# Patient Record
Sex: Female | Born: 1960 | Hispanic: No | Smoking: Never smoker
Health system: Southern US, Community
[De-identification: ages and names within clinical notes are randomized; demographics above are authoritative.]

---

## 2006-09-04 ENCOUNTER — Other Ambulatory Visit: Admission: RE | Admit: 2006-09-04 | Discharge: 2006-09-04 | Payer: Self-pay | Admitting: Obstetrics and Gynecology

## 2015-06-26 ENCOUNTER — Ambulatory Visit (INDEPENDENT_AMBULATORY_CARE_PROVIDER_SITE_OTHER): Payer: Medicaid Other | Admitting: Sports Medicine

## 2015-06-26 ENCOUNTER — Ambulatory Visit (INDEPENDENT_AMBULATORY_CARE_PROVIDER_SITE_OTHER): Payer: Medicaid Other

## 2015-06-26 VITALS — BP 127/85 | HR 86 | Resp 18 | Wt 224.6 lb

## 2015-06-26 DIAGNOSIS — M25521 Pain in right elbow: Secondary | ICD-10-CM | POA: Diagnosis not present

## 2015-06-26 DIAGNOSIS — M7551 Bursitis of right shoulder: Secondary | ICD-10-CM | POA: Diagnosis not present

## 2015-06-26 DIAGNOSIS — M7711 Lateral epicondylitis, right elbow: Secondary | ICD-10-CM

## 2015-06-26 DIAGNOSIS — M25511 Pain in right shoulder: Secondary | ICD-10-CM | POA: Diagnosis not present

## 2015-06-26 DIAGNOSIS — M25531 Pain in right wrist: Secondary | ICD-10-CM | POA: Diagnosis not present

## 2015-06-26 DIAGNOSIS — M75101 Unspecified rotator cuff tear or rupture of right shoulder, not specified as traumatic: Secondary | ICD-10-CM | POA: Insufficient documentation

## 2015-06-26 NOTE — Assessment & Plan Note (Signed)
Has done some home rehabilitation exercises, desires injection.  Common extensor tendon origin injection as above. Counterforce brace. Return in one month.

## 2015-06-26 NOTE — Assessment & Plan Note (Signed)
Likely simple contusion, scaphoid x-rays ordered.

## 2015-06-26 NOTE — Assessment & Plan Note (Signed)
Subacromial injection as above, x-rays, formal physical therapy.

## 2015-06-26 NOTE — Progress Notes (Signed)
Subjective:    CC: Right shoulder, elbow, wrist pain  HPI:  Things developed over a month or so, pain over the deltoid worse with overhead activities, pain at the lateral epicondyle, as well as pain in this anatomical snuffbox after a fall into an outstretched hand. Has done some home exercises and over-the-counter analgesics without any improvement, pain is moderate, persistent and she desires interventional treatment today. Nothing radicular into the hand.  Past medical history, Surgical history, Family history not pertinant except as noted below, Social history, Allergies, and medications have been entered into the medical record, reviewed, and no changes needed.   Review of Systems: No headache, visual changes, nausea, vomiting, diarrhea, constipation, dizziness, abdominal pain, skin rash, fevers, chills, night sweats, swollen lymph nodes, weight loss, chest pain, body aches, joint swelling, muscle aches, shortness of breath, mood changes, visual or auditory hallucinations.  Objective:    General: Well Developed, well nourished, and in no acute distress.  Neuro: Alert and oriented x3, extra-ocular muscles intact, sensation grossly intact.  HEENT: Normocephalic, atraumatic, pupils equal round reactive to light, neck supple, no masses, no lymphadenopathy, thyroid nonpalpable.  Skin: Warm and dry, no rashes noted.  Cardiac: Regular rate and rhythm, no murmurs rubs or gallops.  Respiratory: Clear to auscultation bilaterally. Not using accessory muscles, speaking in full sentences.  Abdominal: Soft, nontender, nondistended, positive bowel sounds, no masses, no organomegaly.  Right Elbow: Unremarkable to inspection. Range of motion full pronation, supination, flexion, extension. Strength is full to all of the above directions Stable to varus, valgus stress. Negative moving valgus stress test. Tender to palpation at the common extensor tendon origin with reproduction of pain with resisted  extension of the middle finger. Ulnar nerve does not sublux. Negative cubital tunnel Tinel's. Right Shoulder: Inspection reveals no abnormalities, atrophy or asymmetry. Palpation is normal with no tenderness over AC joint or bicipital groove. ROM is full in all planes. Rotator cuff strength normal throughout. Positive Neer and Hawkin's tests, empty can. Speeds and Yergason's tests normal. No labral pathology noted with negative Obrien's, negative crank, negative clunk, and good stability. Normal scapular function observed. No painful arc and no drop arm sign. No apprehension sign Right Wrist: Inspection normal with no visible erythema or swelling. ROM smooth and normal with good flexion and extension and ulnar/radial deviation that is symmetrical with opposite wrist. Palpation is normal over metacarpals, navicular, lunate, and TFCC; tendons without tenderness/ swelling Tender to palpation in the snuffbox No tenderness over Canal of [place]. Strength 5/5 in all directions without pain. Negative Finkelstein, tinel's and phalens. Negative Watson's test.  Procedure: Real-time Ultrasound Guided Injection of right subacromial bursa Device: GE Logiq E  Verbal informed consent obtained.  Time-out conducted.  Noted no overlying erythema, induration, or other signs of local infection.  Skin prepped in a sterile fashion.  Local anesthesia: Topical Ethyl chloride.  With sterile technique and under real time ultrasound guidance:  1 mL lidocaine, 1 mL Marcaine, 1 mL kenalog 40 injected easily. Completed without difficulty  Pain immediately resolved suggesting accurate placement of the medication.  Advised to call if fevers/chills, erythema, induration, drainage, or persistent bleeding.  Images permanently stored and available for review in the ultrasound unit.  Impression: Technically successful ultrasound guided injection.  Procedure: Real-time Ultrasound Guided Injection of right common  extensor tendon origin Device: GE Logiq E  Verbal informed consent obtained.  Time-out conducted.  Noted no overlying erythema, induration, or other signs of local infection.  Skin prepped in a sterile  fashion.  Local anesthesia: Topical Ethyl chloride.  With sterile technique and under real time ultrasound guidance:  Medication injected both superficial to and deep to the common extensor tendon origin at the lateral epicondyle for a total of 1 mL lidocaine, 1 mL Marcaine, 1 mL kenalog 40. Completed without difficulty  Pain immediately resolved suggesting accurate placement of the medication.  Advised to call if fevers/chills, erythema, induration, drainage, or persistent bleeding.  Images permanently stored and available for review in the ultrasound unit.  Impression: Technically successful ultrasound guided injection. Impression and Recommendations:    The patient was counselled, risk factors were discussed, anticipatory guidance given.

## 2015-07-14 ENCOUNTER — Ambulatory Visit: Payer: Medicaid Other

## 2015-07-15 ENCOUNTER — Ambulatory Visit: Payer: Medicaid Other

## 2015-07-21 ENCOUNTER — Ambulatory Visit: Payer: Self-pay | Admitting: Physical Therapy

## 2015-07-21 ENCOUNTER — Ambulatory Visit: Payer: Medicaid Other | Attending: Sports Medicine | Admitting: Physical Therapy

## 2015-07-21 DIAGNOSIS — M25521 Pain in right elbow: Secondary | ICD-10-CM | POA: Insufficient documentation

## 2015-07-21 DIAGNOSIS — M25511 Pain in right shoulder: Secondary | ICD-10-CM | POA: Insufficient documentation

## 2015-07-22 NOTE — Therapy (Signed)
[facility] [place] [address]  [address] [place], [place], [address] Phone: [phone]   Fax:  [phone]  Physical Therapy Evaluation  Patient Details  Name: Elizabeth Rich MRN: [id number] Date of Birth: Oct 27, 1960 Referring Provider: Monica Bectonhomas J Thekkekandam, MD  Encounter Date: 07/21/2015      PT End of Session - 07/21/15 1835    Visit Number 1   Authorization Type Medicaid   PT Start Time 1530   PT Stop Time 1615   PT Time Calculation (min) 45 min   Activity Tolerance Patient tolerated treatment well   Behavior During Therapy [facility] for tasks assessed/performed      No past medical history on file.  No past surgical history on file.  There were no vitals filed for this visit.       Subjective Assessment - 07/21/15 1531    Subjective Pt reports pain in R shoulder & elbow started in 01/2016 after sleeping on R side on air mattress for 3 weeks while helping someone move. Has received injections in June  to both and currently w/o pain. In late May/early June, pt reports she fell landing on her L wrist and still has some pan at times from this.   Diagnostic tests All imaging was negative for acute pathology.   Patient Stated Goals To have some exercises to work on to help with my problems.   Currently in Pain? No/denies            [facility] - 07/21/15 1530    Assessment   Medical Diagnosis R Lateral epicondylitis & RTC dysfunction   Referring Provider Monica Bectonhomas J Thekkekandam, MD   Onset Date/Surgical Date --  01/2015   Hand Dominance Right   Prior Therapy none   Precautions   Required Braces or Orthoses Other Brace/Splint   Other Brace/Splint Elbow band for lateral epicondylitis   Prior Function   Level of Independence Independent   Vocation Unemployed   Posture/Postural Control   Posture Comments Slightly forward R shoulder   ROM / Strength   AROM / PROM / Strength AROM;Strength   AROM   Overall AROM  Comments R shoulder symmetrical to L except flexion and IR; Elbow, forearm and wrist WNL   AROM Assessment Site Shoulder;Elbow;Forearm;Wrist   Right/Left Shoulder Right;Left   Right Shoulder Flexion 127 Degrees   Right Shoulder ABduction 148 Degrees   Right Shoulder Internal Rotation 86 Degrees   Right Shoulder External Rotation 93 Degrees   Left Shoulder Flexion 140 Degrees   Strength   Strength Assessment Site Shoulder;Elbow;Forearm;Wrist;Hand   Right/Left Shoulder Right;Left   Right Shoulder Flexion 4/5   Right Shoulder ABduction 4/5   Right Shoulder Internal Rotation 4/5   Right Shoulder External Rotation 4-/5   Left Shoulder Flexion 4+/5   Left Shoulder ABduction 4+/5   Left Shoulder Internal Rotation 4+/5   Left Shoulder External Rotation 4+/5   Right/Left Elbow Right;Left   Right Elbow Flexion 4/5   Right Elbow Extension 4-/5   Left Elbow Flexion 4+/5   Left Elbow Extension 4+/5   Right/Left Forearm Right;Left   Right Forearm Pronation 4/5   Right Forearm Supination 4-/5   Left Forearm Pronation 4+/5   Left Forearm Supination 4+/5   Right/Left Wrist Right;Left   Right Wrist Flexion 4+/5   Right Wrist Extension 4/5   Right Wrist Radial Deviation 4/5   Right Wrist Ulnar Deviation 4/5   Left Wrist Flexion 4+/5   Left Wrist  Extension 4+/5   Left Wrist Radial Deviation 4+/5   Left Wrist Ulnar Deviation 4+/5   Right/Left hand Right;Left   Right Hand Grip (lbs) 29   Left Hand Grip (lbs) 35          Today's Treatment  Postural education  Instruction in HEP per "Pt Instructions" with pt able to perform return demonstration of 1 set of each stretch/exercise appropriately           PT Education - 07/21/15 1610    Education provided Yes   Education Details Postural awareness and HEP   Person(s) Educated Patient   Methods Explanation;Demonstration;Handout   Comprehension Verbalized understanding;Returned demonstration             PT Long Term Goals  - 07/21/15 1615    PT LONG TERM GOAL #1   Title Pt will be independent with HEP   Status Achieved               Plan - 07/21/15 1611    Clinical Impression Statement Elizabeth Rich is a 55 y/o female who presents to OP with R shoulder and elbow pain originating in 01/2016 after sleeping on R side on air mattress for 3 weeks. Pt also sustained trauma to R UE when she fell landing on her R wrist in late May/early June. Pt has since received injections in June to both shoulder and elbow, and is currently w/o pain. Assessment revealed B UE ROM WFL but slightly limited shoulder flexion on R (127 dg) as compared to L (140 dg). Pt is R hand dominant but R UE strength only 4-/5 to 4/5 as compared to 4+/5 on L with R grip strength 29# vs 35# on L. Postural assessment reveals slight forward positioning of L shoulder. Weakness limits pt's functional use of R UE with lifting or carrying objects in R hand. Pt limited to 1 visit per [facility] insurance therefore comprehensive HEP provided targeting improving postural awareness including increasing muscle flexibility along with soft tissue pliability, R shoulder ROM, scapular and RTC strengthening/stabilization, along with forearm and wrist strengthening. Pt able to perform return demonstration of all exercises and instructions provided for continued progression of exercises as tolerated.   Rehab Potential Good   PT Frequency One time visit   PT Treatment/Interventions Patient/family education;Therapeutic exercise;ADLs/Self Care Home Management   PT Next Visit Plan n/a   Consulted and Agree with Plan of Care Patient      Patient will benefit from skilled therapeutic intervention in order to improve the following deficits and impairments:  Pain, Impaired flexibility, Decreased range of motion, Decreased strength, Impaired UE functional use  Visit Diagnosis: Pain in right shoulder  Pain in right elbow     Problem List Patient Active Problem List   Diagnosis  Date Noted  . Subacromial bursitis 06/26/2015  . Lateral epicondylitis of right elbow 06/26/2015  . Right wrist pain 06/26/2015    Marry Guan, PT, MPT 07/22/2015, 8:30 AM  [facility] [address]  [address] [place], [place], [address] Phone: [phone]   Fax:  [phone]  Name: Greidy Sherard MRN: [id number] Date of Birth: April 14, 1960

## 2015-07-24 ENCOUNTER — Ambulatory Visit: Payer: Medicaid Other | Admitting: Sports Medicine

## 2016-02-04 ENCOUNTER — Ambulatory Visit (INDEPENDENT_AMBULATORY_CARE_PROVIDER_SITE_OTHER): Payer: Medicaid Other | Admitting: Sports Medicine

## 2016-02-04 DIAGNOSIS — M7550 Bursitis of unspecified shoulder: Secondary | ICD-10-CM | POA: Diagnosis not present

## 2016-02-04 DIAGNOSIS — M7711 Lateral epicondylitis, right elbow: Secondary | ICD-10-CM | POA: Diagnosis not present

## 2016-02-04 NOTE — Assessment & Plan Note (Signed)
Eight-month response to previous injection, repeat injection today.

## 2016-02-04 NOTE — Assessment & Plan Note (Signed)
Eight-month response to previous injection, repeat injection today. 

## 2016-02-04 NOTE — Progress Notes (Signed)
Subjective:    CC: Follow-up  HPI: I saw Elizabeth Rich about 8 months ago for right subacromial bursitis and lateral epicondylitis, we injected her at that time and she had a fantastic response rate months, desires repeat injections today, pain is moderate, persistent, localized over the deltoid and worse with overhead activities, also has some pain that is localized directly over the lateral upper condyle and worse with resisted wrist extension.  Past medical history:  Negative.  See flowsheet/record as well for more information.  Surgical history: Negative.  See flowsheet/record as well for more information.  Family history: Negative.  See flowsheet/record as well for more information.  Social history: Negative.  See flowsheet/record as well for more information.  Allergies, and medications have been entered into the medical record, reviewed, and no changes needed.   Review of Systems: No fevers, chills, night sweats, weight loss, chest pain, or shortness of breath.   Objective:    General: Well Developed, well nourished, and in no acute distress.  Neuro: Alert and oriented x3, extra-ocular muscles intact, sensation grossly intact.  HEENT: Normocephalic, atraumatic, pupils equal round reactive to light, neck supple, no masses, no lymphadenopathy, thyroid nonpalpable.  Skin: Warm and dry, no rashes. Cardiac: Regular rate and rhythm, no murmurs rubs or gallops, no lower extremity edema.  Respiratory: Clear to auscultation bilaterally. Not using accessory muscles, speaking in full sentences. Right Shoulder: Inspection reveals no abnormalities, atrophy or asymmetry. Palpation is normal with no tenderness over AC joint or bicipital groove. ROM is full in all planes. Rotator cuff strength normal throughout. Positive Neer and Hawkin's tests, empty can. Speeds and Yergason's tests normal. No labral pathology noted with negative Obrien's, negative crank, negative clunk, and good stability. Normal  scapular function observed. No painful arc and no drop arm sign. No apprehension sign Right Elbow: Unremarkable to inspection. Range of motion full pronation, supination, flexion, extension. Strength is full to all of the above directions Stable to varus, valgus stress. Negative moving valgus stress test. Tender to palpation of the common extensor tendon origin Ulnar nerve does not sublux. Negative cubital tunnel Tinel's.  Procedure: Real-time Ultrasound Guided Injection of right subacromial bursa Device: GE Logiq E  Verbal informed consent obtained.  Time-out conducted.  Noted no overlying erythema, induration, or other signs of local infection.  Skin prepped in a sterile fashion.  Local anesthesia: Topical Ethyl chloride.  With sterile technique and under real time ultrasound guidance:  1 mL kenalog 40, 1 mL lidocaine, 1 mL Marcaine injected easily Completed without difficulty  Pain immediately resolved suggesting accurate placement of the medication.  Advised to call if fevers/chills, erythema, induration, drainage, or persistent bleeding.  Images permanently stored and available for review in the ultrasound unit.  Impression: Technically successful ultrasound guided injection.  Procedure: Real-time Ultrasound Guided Injection of right common extensor tendon origin Device: GE Logiq E  Verbal informed consent obtained.  Time-out conducted.  Noted no overlying erythema, induration, or other signs of local infection.  Skin prepped in a sterile fashion.  Local anesthesia: Topical Ethyl chloride.  With sterile technique and under real time ultrasound guidance:  1 mL kenalog 40, 1 mL lidocaine, 1 mL Marcaine injected easily Completed without difficulty  Pain immediately resolved suggesting accurate placement of the medication.  Advised to call if fevers/chills, erythema, induration, drainage, or persistent bleeding.  Images permanently stored and available for review in the  ultrasound unit.  Impression: Technically successful ultrasound guided injection.  Impression and Recommendations:    Lateral  epicondylitis of right elbow Eight-month response to previous injection, repeat injection today.  Subacromial bursitis Eight-month response to previous injection, repeat injection today.

## 2016-07-19 ENCOUNTER — Ambulatory Visit (INDEPENDENT_AMBULATORY_CARE_PROVIDER_SITE_OTHER): Payer: Medicaid Other | Admitting: Sports Medicine

## 2016-07-19 DIAGNOSIS — M7550 Bursitis of unspecified shoulder: Secondary | ICD-10-CM

## 2016-07-19 DIAGNOSIS — M7711 Lateral epicondylitis, right elbow: Secondary | ICD-10-CM

## 2016-07-19 NOTE — Progress Notes (Signed)
Subjective:    CC: Shoulder and elbow pain  HPI: This is a pleasant 56 year old female, we injected her right common extensor tendon origin as well as right subacromial bursa back in January, she did well but admits that she has not been compliant with her rehabilitation exercises. She's having recurrence of pain, moderate, persistent, localized over the deltoid and worse with overhead activities but also over the common extensor tendon origin. Desires repeat interventional treatment today.  Past medical history:  Negative.  See flowsheet/record as well for more information.  Surgical history: Negative.  See flowsheet/record as well for more information.  Family history: Negative.  See flowsheet/record as well for more information.  Social history: Negative.  See flowsheet/record as well for more information.  Allergies, and medications have been entered into the medical record, reviewed, and no changes needed.   Review of Systems: No fevers, chills, night sweats, weight loss, chest pain, or shortness of breath.   Objective:    General: Well Developed, well nourished, and in no acute distress.  Neuro: Alert and oriented x3, extra-ocular muscles intact, sensation grossly intact.  HEENT: Normocephalic, atraumatic, pupils equal round reactive to light, neck supple, no masses, no lymphadenopathy, thyroid nonpalpable.  Skin: Warm and dry, no rashes. Cardiac: Regular rate and rhythm, no murmurs rubs or gallops, no lower extremity edema.  Respiratory: Clear to auscultation bilaterally. Not using accessory muscles, speaking in full sentences. Right shoulder: Inspection reveals no abnormalities, atrophy or asymmetry. Palpation is normal with no tenderness over AC joint or bicipital groove. ROM is full in all planes. Rotator cuff strength normal throughout. Positive Neer and Hawkin's tests, empty can. Speeds and Yergason's tests normal. No labral pathology noted with negative Obrien's, negative  crank, negative clunk, and good stability. Normal scapular function observed. No painful arc and no drop arm sign. No apprehension sign Right Elbow: Unremarkable to inspection. Range of motion full pronation, supination, flexion, extension. Strength is full to all of the above directions Stable to varus, valgus stress. Negative moving valgus stress test. Tender to palpation at the common extensor tendon origin Ulnar nerve does not sublux. Negative cubital tunnel Tinel's.  Procedure: Real-time Ultrasound Guided Injection of right subacromial bursa Device: GE Logiq E  Verbal informed consent obtained.  Time-out conducted.  Noted no overlying erythema, induration, or other signs of local infection.  Skin prepped in a sterile fashion.  Local anesthesia: Topical Ethyl chloride.  With sterile technique and under real time ultrasound guidance:  1 mL kenalog 40, 1 mL lidocaine, 1 mL Marcaine injected easily Completed without difficulty  Pain immediately resolved suggesting accurate placement of the medication.  Advised to call if fevers/chills, erythema, induration, drainage, or persistent bleeding.  Images permanently stored and available for review in the ultrasound unit.  Impression: Technically successful ultrasound guided injection.  Procedure: Real-time Ultrasound Guided Injection of right common extensor tendon origin Device: GE Logiq E  Verbal informed consent obtained.  Time-out conducted.  Noted no overlying erythema, induration, or other signs of local infection.  Skin prepped in a sterile fashion.  Local anesthesia: Topical Ethyl chloride.  With sterile technique and under real time ultrasound guidance:  1 mL kenalog 40, 1 mL lidocaine, 1 mL Marcaine injected easily Completed without difficulty  Pain immediately resolved suggesting accurate placement of the medication.  Advised to call if fevers/chills, erythema, induration, drainage, or persistent bleeding.  Images  permanently stored and available for review in the ultrasound unit.  Impression: Technically successful ultrasound guided injection.  Impression  and Recommendations:    Lateral epicondylitis of right elbow 5 month response to previous injection, repeated today. Has not been diligent with rehabilitation exercises and agrees to do these more.  Subacromial bursitis 5 month response to previous injection, repeated today. Has not been diligent with rehabilitation exercises and agrees to do these more.

## 2016-07-19 NOTE — Assessment & Plan Note (Signed)
5 month response to previous injection, repeated today. Has not been diligent with rehabilitation exercises and agrees to do these more. 

## 2016-07-19 NOTE — Assessment & Plan Note (Signed)
5 month response to previous injection, repeated today. Has not been diligent with rehabilitation exercises and agrees to do these more.

## 2016-07-25 ENCOUNTER — Telehealth: Payer: Self-pay

## 2016-07-25 DIAGNOSIS — M67919 Unspecified disorder of synovium and tendon, unspecified shoulder: Secondary | ICD-10-CM

## 2016-07-25 DIAGNOSIS — M7710 Lateral epicondylitis, unspecified elbow: Secondary | ICD-10-CM

## 2016-07-25 NOTE — Telephone Encounter (Signed)
Pt was seen on 07/19/16.  She got an injection in her shoulder and elbow.  She reports that the injections helped a little bit but the pain never completely went away.  She stated that the pain is worse than it was before the injections.  She said when she got injections back in January the pain went away shortly after. She wants to know should she schedule an follow up appointment. Please advise -EH/RMA

## 2016-07-25 NOTE — Telephone Encounter (Signed)
Worse in both the elbow and shoulder or one or the other?  Also she hadn't been doing the rehab exercises.  Please remind her that the injections aren't supposed to provide any long term relief but that AGGRESSIVELY rehabbing the elbow and shoulder will.  Rehab makes the pain go away.  We may need to put her into formal physical therapy.  Exercise is medicine and in general we don't push patients hard enough to do the actual hard work/labor they need to be doing to improve long term.

## 2016-07-26 ENCOUNTER — Ambulatory Visit: Payer: Self-pay | Admitting: Sports Medicine

## 2016-07-26 NOTE — Telephone Encounter (Signed)
Ice for 20 mins TID.  Adding referral for physical therapy.

## 2016-07-26 NOTE — Telephone Encounter (Signed)
Pt stated that it's both her elbow and shoulder. She said that she never did the exercisers even after the first set injections.  She wants to know is it ok to use ice and icy hot. Pt reports no swelling or redness.  I did stress to pt that with PT and time this would get better. -EH/RMA

## 2016-07-27 NOTE — Telephone Encounter (Signed)
Recommendations left on vm -EH/RMA  

## 2016-08-08 ENCOUNTER — Ambulatory Visit: Payer: Medicaid Other | Admitting: Physical Therapy

## 2016-08-19 ENCOUNTER — Ambulatory Visit (INDEPENDENT_AMBULATORY_CARE_PROVIDER_SITE_OTHER): Payer: Medicaid Other | Admitting: Sports Medicine

## 2016-08-19 ENCOUNTER — Ambulatory Visit (INDEPENDENT_AMBULATORY_CARE_PROVIDER_SITE_OTHER): Payer: Medicaid Other

## 2016-08-19 ENCOUNTER — Telehealth: Payer: Self-pay

## 2016-08-19 ENCOUNTER — Encounter: Payer: Self-pay | Admitting: Sports Medicine

## 2016-08-19 DIAGNOSIS — M170 Bilateral primary osteoarthritis of knee: Secondary | ICD-10-CM | POA: Diagnosis not present

## 2016-08-19 MED ORDER — MELOXICAM 15 MG PO TABS: ORAL_TABLET | ORAL | 3 refills | Status: DC

## 2016-08-19 NOTE — Progress Notes (Signed)
   Subjective:    I'm seeing this patient as a consultation for:  Tobie LordsLauren Young, PA-C  CC: Bilateral knee pain  HPI: This is a pleasant 56 year old female, for years she's had severe pain on and off that she localizes at the medial joint line of both knees with gelling, no mechanical symptoms, no trauma. Pain is moderate, persistent, she has had injections in the past, today her pain is fairly severe and she desires interventional treatment today.  Past medical history, Surgical history, Family history not pertinant except as noted below, Social history, Allergies, and medications have been entered into the medical record, reviewed, and no changes needed.   Review of Systems: No headache, visual changes, nausea, vomiting, diarrhea, constipation, dizziness, abdominal pain, skin rash, fevers, chills, night sweats, weight loss, swollen lymph nodes, body aches, joint swelling, muscle aches, chest pain, shortness of breath, mood changes, visual or auditory hallucinations.   Objective:   General: Well Developed, well nourished, and in no acute distress.  Neuro:  Extra-ocular muscles intact, able to move all 4 extremities, sensation grossly intact.  Deep tendon reflexes tested were normal. Psych: Alert and oriented, mood congruent with affect. ENT:  Ears and nose appear unremarkable.  Hearing grossly normal. Neck: Unremarkable overall appearance, trachea midline.  No visible thyroid enlargement. Eyes: Conjunctivae and lids appear unremarkable.  Pupils equal and round. Skin: Warm and dry, no rashes noted.  Cardiovascular: Pulses palpable, no extremity edema. Bilateral knees: Tender to palpation at the medial joint lines, no swelling, no effusion. ROM normal in flexion and extension and lower leg rotation. Ligaments with solid consistent endpoints including ACL, PCL, LCL, MCL. Negative Mcmurray's and provocative meniscal tests. Non painful patellar compression. Patellar and quadriceps tendons  unremarkable. Hamstring and quadriceps strength is normal.  Procedure: Real-time Ultrasound Guided Injection of left knee Device: GE Logiq E  Verbal informed consent obtained.  Time-out conducted.  Noted no overlying erythema, induration, or other signs of local infection.  Skin prepped in a sterile fashion.  Local anesthesia: Topical Ethyl chloride.  With sterile technique and under real time ultrasound guidance:  1 mL Kenalog 40, 2 mL lidocaine, 2 mL bupivacaine injected easily Completed without difficulty  Pain immediately resolved suggesting accurate placement of the medication.  Advised to call if fevers/chills, erythema, induration, drainage, or persistent bleeding.  Images permanently stored and available for review in the ultrasound unit.  Impression: Technically successful ultrasound guided injection.  Procedure: Real-time Ultrasound Guided Injection of right knee Device: GE Logiq E  Verbal informed consent obtained.  Time-out conducted.  Noted no overlying erythema, induration, or other signs of local infection.  Skin prepped in a sterile fashion.  Local anesthesia: Topical Ethyl chloride.  With sterile technique and under real time ultrasound guidance:  1 mL Kenalog 40, 2 mL lidocaine, 2 mL bupivacaine injected easily Completed without difficulty  Pain immediately resolved suggesting accurate placement of the medication.  Advised to call if fevers/chills, erythema, induration, drainage, or persistent bleeding.  Images permanently stored and available for review in the ultrasound unit.  Impression: Technically successful ultrasound guided injection.  Impression and Recommendations:   This case required medical decision making of moderate complexity.  Primary osteoarthritis of both knees Bilateral injections, x-rays, meloxicam, formal physical therapy.  Return in one month.

## 2016-08-19 NOTE — Telephone Encounter (Signed)
Pt was seen today.  Is wanting to know what should she take for pain. She thought that she was getting Rx Vicodin. When can she start back driving and should she ice her knees. If so how often? Please advise. -EH/RMA

## 2016-08-19 NOTE — Telephone Encounter (Signed)
I sent in meloxicam for pain, also the injections were the MAIN treatment for the pain. She can start driving today, icing should be 20 minutes, 3-4 times per day.

## 2016-08-19 NOTE — Assessment & Plan Note (Signed)
Bilateral injections, x-rays, meloxicam, formal physical therapy.  Return in one month.

## 2016-08-22 MED ORDER — DICLOFENAC SODIUM 75 MG PO TBEC: 75.0000 mg | DELAYED_RELEASE_TABLET | Freq: Two times a day (BID) | ORAL | 3 refills | Status: DC

## 2016-08-22 NOTE — Telephone Encounter (Signed)
Pt reports that the meloxicam is making her sick.  What other meds can she take? -EH/RMA

## 2016-08-22 NOTE — Telephone Encounter (Signed)
Sick meaning nauseated or epigastric pain or both or something else?

## 2016-08-22 NOTE — Telephone Encounter (Signed)
Switching to diclofenac, she needs to take it with a moderate sized meal.

## 2016-08-22 NOTE — Telephone Encounter (Signed)
Pt notified -EH/RMA  

## 2016-08-22 NOTE — Telephone Encounter (Signed)
Sick meaning nausea -EH/RMA

## 2016-08-29 ENCOUNTER — Ambulatory Visit: Payer: Medicaid Other | Admitting: Physical Therapy

## 2016-09-01 ENCOUNTER — Ambulatory Visit: Payer: Medicaid Other | Attending: Sports Medicine | Admitting: Physical Therapy

## 2016-09-01 DIAGNOSIS — M25562 Pain in left knee: Secondary | ICD-10-CM

## 2016-09-01 DIAGNOSIS — M25561 Pain in right knee: Secondary | ICD-10-CM | POA: Diagnosis not present

## 2016-09-01 DIAGNOSIS — R262 Difficulty in walking, not elsewhere classified: Secondary | ICD-10-CM

## 2016-09-01 DIAGNOSIS — R29898 Other symptoms and signs involving the musculoskeletal system: Secondary | ICD-10-CM

## 2016-09-01 NOTE — Therapy (Signed)
[facility] [place] [address]  [address] [place], [place], [address] Phone: [phone]   Fax:  [phone]  Physical Therapy Evaluation  Patient Details  Name: Elizabeth Rich MRN: [id number] Date of Birth: Mar 07, 1960 Referring Provider: Dr. Benjamin Stainhekkekandam  Encounter Date: 09/01/2016      PT End of Session - 09/01/16 1124    Visit Number 1   Authorization Type Adult Medicaid - 1 visit only   PT Start Time 1102   PT Stop Time 1145   PT Time Calculation (min) 43 min   Activity Tolerance Patient tolerated treatment well   Behavior During Therapy [facility] for tasks assessed/performed      No past medical history on file.  No past surgical history on file.  There were no vitals filed for this visit.       Subjective Assessment - 09/01/16 1104    Subjective Patient reporting onset of knee pain after mattress shopping - had intense pain when sitting on/off beds as well as transferring in/out of car. Has been seen by MD - reports osteoarthritis. Recently had injections of B knees - reports 1 day of good pain relief, now having some discomfort and stiffness of B knees. Reports health issues that limits her out of home activities, but does try to do stairs daily within home.    Diagnostic tests Xray - "mild osteoarthritis of both knees"   Patient Stated Goals improve pain   Currently in Pain? Yes   Pain Score 4   4/10 seated; up to 6/10 with walking   Pain Location Knee   Pain Orientation Right;Left   Pain Descriptors / Indicators Discomfort;Aching;Sore   Pain Type Chronic pain   Pain Onset More than a month ago   Pain Frequency Constant   Aggravating Factors  walking   Pain Relieving Factors Tumeric, rest            [facility] PT Assessment - 09/01/16 1108      Assessment   Medical Diagnosis Primary osteoarthritis of both knees   Referring Provider Dr. Benjamin Stainhekkekandam   Onset Date/Surgical Date --  ~2 weeks ago   Next MD Visit --   ~ 2 weeks   Prior Therapy yes - not for this issue     Precautions   Precautions None     Restrictions   Weight Bearing Restrictions No     Balance Screen   Has the patient fallen in the past 6 months No   Has the patient had a decrease in activity level because of a fear of falling?  No   Is the patient reluctant to leave their home because of a fear of falling?  No     Home Environment   Living Environment Private residence   Living Arrangements Children   Type of Home House   Home Layout Two level   Alternate Level Stairs-Number of Steps 14   Alternate Level Stairs-Rails Right     Prior Function   Level of Independence Independent   Vocation Unemployed     Cognition   Overall Cognitive Status Within Functional Limits for tasks assessed     Sensation   Light Touch Appears Intact     Coordination   Gross Motor Movements are Fluid and Coordinated Yes     Posture/Postural Control   Posture/Postural Control Postural limitations   Postural Limitations Rounded Shoulders;Forward head     ROM / Strength   AROM / PROM / Strength AROM;Strength  AROM   AROM Assessment Site Knee   Right/Left Knee Right;Left   Right Knee Extension -2   Right Knee Flexion 125   Left Knee Extension -3   Left Knee Flexion 126     Strength   Strength Assessment Site Hip;Knee   Right/Left Hip Right;Left   Right Hip Flexion 4-/5   Left Hip Flexion 4/5   Right/Left Knee Right;Left   Right Knee Flexion 4/5   Right Knee Extension 4/5   Left Knee Flexion 4+/5   Left Knee Extension 4+/5     Flexibility   Soft Tissue Assessment /Muscle Length yes   Hamstrings B: mild tightness     Palpation   Palpation comment joint line tenderness B knees (medial > lateral)            Objective measurements completed on examination: See above findings.          [facility] Adult PT Treatment/Exercise - 09/01/16 1108      Exercises   Exercises Knee/Hip     Knee/Hip Exercises: Stretches    Passive Hamstring Stretch Right;Left;2 reps;30 seconds   Passive Hamstring Stretch Limitations supine with strap     Knee/Hip Exercises: Standing   Heel Raises Both;10 reps   Heel Raises Limitations 5 sec hold   Hip Flexion Both;10 reps;Knee bent   Hip Abduction Both;10 reps;Knee straight   Hip Extension Both;10 reps;Knee straight     Knee/Hip Exercises: Seated   Long Arc Quad Right;Left;10 reps   Long Arc Quad Limitations 5 sec hold     Knee/Hip Exercises: Supine   Bridges Both;10 reps   Bridges Limitations 5 sec hold   Straight Leg Raises Both;10 reps   Straight Leg Raise with External Rotation Both;10 reps                PT Education - 09/01/16 1123    Education provided Yes   Education Details exam findings, HEP, [facility] pro bono clinic   Person(s) Educated Patient   Methods Explanation;Demonstration;Handout   Comprehension Verbalized understanding;Returned demonstration             PT Long Term Goals - 09/01/16 1309      PT LONG TERM GOAL #1   Title Pt will be independent with HEP   Status Achieved                Plan - 09/01/16 1304    Clinical Impression Statement patient is a 56 y/o female presenting to OPPT today with primary complaints of recent onset of B knee pain. Patient reporting knee pain of moderate intensity at all times with associated stiffness. Patient today with good and symmetrical AROM of B knees, however some reduced strength at proximal hips and knees. Due to patient financial constraints, this is a 1 time visit with PT [facility] as patient and PT both agrree patient would greatly benefit from PT intervention.    Clinical Presentation Stable   Clinical Decision Making Low   Rehab Potential Good   PT Frequency One time visit   PT Treatment/Interventions ADLs/Self Care Home Management;Cryotherapy;Electrical Stimulation;Moist Heat;Ultrasound;Therapeutic exercise;Therapeutic  activities;Functional mobility training;Stair training;Gait training;Patient/family education;Manual techniques;Passive range of motion;Vasopneumatic Device;Taping;Dry needling   Consulted and Agree with Plan of Care Patient      Patient will benefit from skilled therapeutic intervention in order to improve the following deficits and impairments:  Abnormal gait, Decreased activity tolerance, Decreased mobility, Decreased strength, Pain, Difficulty walking  Visit Diagnosis: Acute pain of right knee - Plan: PT plan of care cert/re-cert  Acute pain of left knee - Plan: PT plan of care cert/re-cert  Difficulty in walking, not elsewhere classified - Plan: PT plan of care cert/re-cert  Other symptoms and signs involving the musculoskeletal system - Plan: PT plan of care cert/re-cert     Problem List Patient Active Problem List   Diagnosis Date Noted  . Primary osteoarthritis of both knees 08/19/2016  . Subacromial bursitis 06/26/2015  . Lateral epicondylitis of right elbow 06/26/2015  . Right wrist pain 06/26/2015     Kipp Laurence, PT, DPT 09/01/16 1:12 PM   [facility] [address]  [address] [place], [place], [address] Phone: [phone]   Fax:  [phone]  Name: Elizabeth Rich MRN: [id number] Date of Birth: 1960/08/31

## 2016-09-01 NOTE — Patient Instructions (Addendum)
Hamstring Step 2   Left foot relaxed, knee straight, other leg bent, foot flat. Raise straight leg further upward to maximal range. Hold _30_ seconds. Relax leg completely down. Repeat _3__ times.  Strengthening: Straight Leg Raise (Phase 1)   Tighten muscles on front of right thigh, then lift leg _6-8___ inches from surface, keeping knee locked.  Repeat __15__ times per set. Do __2__ sets per session.   Straight Leg Raise: With External Leg Rotation   Lie on back with right leg straight, opposite leg bent. Rotate straight leg out and lift __6-8__ inches. Repeat _15___ times per set. Do __2__ sets per session.   Bridge   Lie back, legs bent. Inhale, pressing hips up. Keeping ribs in, lengthen lower back. Exhale, rolling down along spine from top. Repeat __15__ times. Do __2__ sessions per day.  Long [employer] Quad   Straighten operated leg and try to hold it __5__ seconds. Use _0___ lbs on ankle. Repeat _15___ times. Do __2__ sessions a day.  Knee High   Holding stable object, raise knee to hip level, then lower knee. Repeat with other knee. Complete __10-15_ repetitions. Do __2__ sessions per day.  ABDUCTION: Standing (Active)   Stand, feet flat. Lift right leg out to side. Use _0__ lbs. Complete __10-15_ repetitions. Perform __2_ sessions per day.  EXTENSION: Standing (Active)  Stand, both feet flat. Draw right leg behind body as far as possible. Use 0___ lbs. Complete 10-15 repetitions. Perform __2_ sessions per day.  Heel Raise: Bilateral (Standing)   Rise on balls of feet. Repeat __15__ times per set. Do __2__ sets per session.   Mini Squat: Double Leg   With feet shoulder width apart, reach forward for balance and do a mini squat. Keep knees in line with second toe. Knees do not go past toes. Repeat _15__ times per set. Do _2__ sets per session.

## 2016-09-16 ENCOUNTER — Ambulatory Visit: Payer: Self-pay | Admitting: Sports Medicine

## 2016-09-21 ENCOUNTER — Ambulatory Visit (INDEPENDENT_AMBULATORY_CARE_PROVIDER_SITE_OTHER): Payer: Medicaid Other

## 2016-09-21 ENCOUNTER — Encounter: Payer: Self-pay | Admitting: Sports Medicine

## 2016-09-21 ENCOUNTER — Ambulatory Visit (INDEPENDENT_AMBULATORY_CARE_PROVIDER_SITE_OTHER): Payer: Medicaid Other | Admitting: Sports Medicine

## 2016-09-21 VITALS — BP 135/84 | HR 94 | Resp 18 | Wt 229.0 lb

## 2016-09-21 DIAGNOSIS — M170 Bilateral primary osteoarthritis of knee: Secondary | ICD-10-CM

## 2016-09-21 DIAGNOSIS — M542 Cervicalgia: Secondary | ICD-10-CM

## 2016-09-21 DIAGNOSIS — M50321 Other cervical disc degeneration at C4-C5 level: Secondary | ICD-10-CM

## 2016-09-21 DIAGNOSIS — M47812 Spondylosis without myelopathy or radiculopathy, cervical region: Secondary | ICD-10-CM | POA: Insufficient documentation

## 2016-09-21 NOTE — Assessment & Plan Note (Signed)
Right-sided upper neck, pain with turning to the right, nothing radicular. This is likely facet arthritis.  X-rays, rehabilitation exercises, she will also add this to her formal PT. Return in if no better in 4 weeks.  She also had multiple questions about what appears to be an infiltrated IV from a colonoscopy yesterday as well as eustachian tube dysfunction and cervical lymphadenopathy, I asked her discuss this with her ENT as well as primary care provider.

## 2016-09-21 NOTE — Assessment & Plan Note (Signed)
Doing very well after injections at the last visit. She is going to work hard with physical therapy. Visco if no better. Return as needed.

## 2016-09-21 NOTE — Progress Notes (Signed)
   Subjective:    I'm seeing this patient as a consultation for:  Elizabeth Lords PA-C  CC: Neck pain  HPI: This is a pleasant 56 year old female, for the past several days she's had right-sided upper neck pain, worse with turning the next the right, nothing radicular, axial pain only, no trauma, no constitutional symptoms. Moderate, persistent.  Knee arthritis: Pain-free now after injections, she is going to start physical therapy.  Past medical history, Surgical history, Family history not pertinant except as noted below, Social history, Allergies, and medications have been entered into the medical record, reviewed, and no changes needed.   Review of Systems: No headache, visual changes, nausea, vomiting, diarrhea, constipation, dizziness, abdominal pain, skin rash, fevers, chills, night sweats, weight loss, swollen lymph nodes, body aches, joint swelling, muscle aches, chest pain, shortness of breath, mood changes, visual or auditory hallucinations.   Objective:   General: Well Developed, well nourished, and in no acute distress.  Neuro:  Extra-ocular muscles intact, able to move all 4 extremities, sensation grossly intact.  Deep tendon reflexes tested were normal. Psych: Alert and oriented, mood congruent with affect. ENT:  Ears and nose appear unremarkable.  Hearing grossly normal. Neck: Unremarkable overall appearance, trachea midline.  No visible thyroid enlargement. Eyes: Conjunctivae and lids appear unremarkable.  Pupils equal and round. Skin: Warm and dry, no rashes noted.  Cardiovascular: Pulses palpable, no extremity edema. Neck: Negative spurling's Full neck range of motion Grip strength and sensation normal in bilateral hands Strength good C4 to T1 distribution No sensory change to C4 to T1 Reflexes normal  Impression and Recommendations:   This case required medical decision making of moderate complexity.  Primary osteoarthritis of both knees Doing very well after  injections at the last visit. She is going to work hard with physical therapy. Visco if no better. Return as needed.  Neck pain Right-sided upper neck, pain with turning to the right, nothing radicular. This is likely facet arthritis.  X-rays, rehabilitation exercises, she will also add this to her formal PT. Return in if no better in 4 weeks.  She also had multiple questions about what appears to be an infiltrated IV from a colonoscopy yesterday as well as eustachian tube dysfunction and cervical lymphadenopathy, I asked her discuss this with her ENT as well as primary care provider.

## 2016-10-19 ENCOUNTER — Ambulatory Visit: Payer: Self-pay | Admitting: Sports Medicine

## 2016-11-04 ENCOUNTER — Ambulatory Visit (INDEPENDENT_AMBULATORY_CARE_PROVIDER_SITE_OTHER): Payer: Medicaid Other | Admitting: Sports Medicine

## 2016-11-04 ENCOUNTER — Encounter: Payer: Self-pay | Admitting: Sports Medicine

## 2016-11-04 DIAGNOSIS — M7550 Bursitis of unspecified shoulder: Secondary | ICD-10-CM

## 2016-11-04 DIAGNOSIS — M7711 Lateral epicondylitis, right elbow: Secondary | ICD-10-CM | POA: Diagnosis not present

## 2016-11-04 NOTE — Assessment & Plan Note (Signed)
4 month response to previous, extensor tendon injection. Again, has not been diligent with rehabilitation exercises. Tendon did look somewhat diminutive on ultrasound, I think we will avoid further steroid injections, if there is another injection at should be PRP, and would like her to do formal physical therapy.

## 2016-11-04 NOTE — Assessment & Plan Note (Signed)
Repeat right subacromial injection after 4 months. Again, she needs to get compliant with her therapy.

## 2016-11-04 NOTE — Progress Notes (Signed)
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 We did injections into both structures 4 months ago. She did well initially, unfortunately she did not do physical therapy as recommended, and never did a home exercises either. Now she has recurrence of pain at the common extensor tendon origin as well as over the deltoid and worse with overhead activities.  Past medical history:  Negative.  See flowsheet/record as well for more information.  Surgical history: Negative.  See flowsheet/record as well for more information.  Family history: Negative.  See flowsheet/record as well for more information.  Social history: Negative.  See flowsheet/record as well for more information.  Allergies, and medications have been entered into the medical record, reviewed, and no changes needed.   Review of Systems: No fevers, chills, night sweats, weight loss, chest pain, or shortness of breath.   Objective:    General: Well Developed, well nourished, and in no acute distress.  Neuro: Alert and oriented x3, extra-ocular muscles intact, sensation grossly intact.  HEENT: Normocephalic, atraumatic, pupils equal round reactive to light, neck supple, no masses, no lymphadenopathy, thyroid nonpalpable.  Skin: Warm and dry, no rashes. Cardiac: Regular rate and rhythm, no murmurs rubs or gallops, no lower extremity edema.  Respiratory: Clear to auscultation bilaterally. Not using accessory muscles, speaking in full sentences. Right Elbow: Unremarkable to inspection. Range of motion full pronation, supination, flexion, extension. Strength is full to all of the above directions Stable to varus, valgus stress. Negative moving valgus stress test. Tender to palpation at the common extensor tendon origin Ulnar nerve does not sublux. Negative cubital tunnel Tinel's. Right  Shoulder: Inspection reveals no abnormalities, atrophy or asymmetry. Palpation is normal with no tenderness over AC joint or bicipital groove. ROM is full in all planes. Rotator cuff strength normal throughout. Positive Neer and Hawkin's tests, empty can. Speeds and Yergason's tests normal. No labral pathology noted with negative Obrien's, negative crank, negative clunk, and good stability. Normal scapular function observed. No painful arc and no drop arm sign. No apprehension sign  Procedure: Real-time Ultrasound Guided Injection of right subacromial bursa Device: GE Logiq E  Verbal informed consent obtained.  Time-out conducted.  Noted no overlying erythema, induration, or other signs of local infection.  Skin prepped in a sterile fashion.  Local anesthesia: Topical Ethyl chloride.  With sterile technique and under real time ultrasound guidance: There is some thickening of the bursa consistent with bursitis, supraspinatus was intact, 25-gauge needle advanced into the bursa, 1 mL kenalog 40, 1 mL lidocaine, 1 mL bupivacaine injected easily.  Completed without difficulty  Pain immediately resolved suggesting accurate placement of the medication.  Advised to call if fevers/chills, erythema, induration, drainage, or persistent bleeding.  Images permanently stored and available for review in the ultrasound unit.  Impression: Technically successful ultrasound guided injection.  Procedure: Real-time Ultrasound Guided Injection of right common extensor tendon origin Device: GE Logiq E  Verbal informed consent obtained.  Time-out conducted.  Noted no overlying erythema, induration, or other signs of local infection.  Skin prepped in a sterile fashion.  Local anesthesia: Topical Ethyl chloride.  With sterile technique and under real time ultrasound guidance:  Noted diminished an appearance of the common extensor tendon with hypoechoic change, I then advanced a 25-gauge needle and injected  medication both superficial to and deep to the common extensor tendon at the lateral epicondyle  for a total of 1 mL Kenalog 40, 1 mL lidocaine, 1 mL bupivacaine. Completed without difficulty  Pain immediately resolved suggesting accurate placement of the medication.  Advised to call if fevers/chills, erythema, induration, drainage, or persistent bleeding.  Images permanently stored and available for review in the ultrasound unit.  Impression: Technically successful ultrasound guided injection.  Impression and Recommendations:    Lateral epicondylitis of right elbow 4 month response to previous, extensor tendon injection. Again, has not been diligent with rehabilitation exercises. Tendon did look somewhat diminutive on ultrasound, I think we will avoid further steroid injections, if there is another injection at should be PRP, and would like her to do formal physical therapy.  Subacromial bursitis Repeat right subacromial injection after 4 months. Again, she needs to get compliant with her therapy.  ___________________________________________ Ihor Austin. Benjamin Stain, M.D., ABFM., CAQSM. Primary Care and Sports Medicine  [facility]  Adjunct Instructor of Family Medicine  [employer]

## 2016-11-21 ENCOUNTER — Ambulatory Visit: Payer: Medicaid Other | Attending: Sports Medicine | Admitting: Physical Therapy

## 2016-11-21 DIAGNOSIS — M25511 Pain in right shoulder: Secondary | ICD-10-CM | POA: Insufficient documentation

## 2016-11-21 DIAGNOSIS — R293 Abnormal posture: Secondary | ICD-10-CM | POA: Diagnosis present

## 2016-11-21 DIAGNOSIS — M25521 Pain in right elbow: Secondary | ICD-10-CM | POA: Insufficient documentation

## 2016-11-21 NOTE — Therapy (Addendum)
[facility] [address]  [place], [place], [address] Phone: [phone]   Fax:  [phone]  Physical Therapy Evaluation  Patient Details  Name: Elizabeth Rich MRN: [id number] Date of Birth: 09/17/1960 Referring Provider: Aundria Mems, MD  Encounter Date: 11/21/2016      PT End of Session - 11/21/16 1535    Visit Number 1   Number of Visits 4   Date for PT Re-Evaluation 12/19/16   Authorization Type Medicaid   PT Start Time 6440   PT Stop Time 1532   PT Time Calculation (min) 47 min   Activity Tolerance Patient tolerated treatment well   Behavior During Therapy [facility] for tasks assessed/performed      No past medical history on file.  No past surgical history on file.  There were no vitals filed for this visit.       Subjective Assessment - 11/21/16 1448    Subjective Pt reports 2 years ago in Dec, she was doing a lot of lifting and carrying while helping her niece move, causing her R shoulder and elbow to hurt. Did not immediately seek treatment. Has now had a series of 3 shots over the past 2 years - get temp relief from shots, but pain never goes completely away over the long term.   Patient Stated Goals "painfree & medicine free (no more injections)"   Currently in Pain? No/denies   Pain Score 0-No pain  up to 8-9/10 when she needs to go for shot   Pain Location Elbow  & shoulder   Pain Orientation Right   Pain Descriptors / Indicators Sharp;Shooting;Aching   Pain Type Chronic pain   Aggravating Factors  uncertain; gradually increases over time   Pain Relieving Factors injections; ice   Effect of Pain on Daily Activities gets to the point where she can't open jars or lift anything heavy            [facility] PT Assessment - 11/21/16 1445      Assessment   Medical Diagnosis R subacromial bursitis & R lateral epicondylitis   Referring Provider Aundria Mems, MD   Onset Date/Surgical  Date --  Dec 2016   Hand Dominance Right   Next MD Visit none scheduled     Balance Screen   Has the patient fallen in the past 6 months No   Has the patient had a decrease in activity level because of a fear of falling?  No   Is the patient reluctant to leave their home because of a fear of falling?  No     Home Environment   Living Environment Private residence   Living Arrangements Children   Type of [place] Two level     Prior Function   Level of Independence Independent   Vocation Unemployed   Leisure mostly sedentary; puzzle games     Posture/Postural Control   Posture/Postural Control Postural limitations   Postural Limitations Forward head;Rounded Shoulders;Increased thoracic kyphosis;Decreased lumbar lordosis     ROM / Strength   AROM / PROM / Strength AROM;Strength     AROM   Overall AROM Comments B elbow & wrist WFL   AROM Assessment Site Shoulder;Elbow;Forearm;Wrist   Right/Left Shoulder Right;Left   Right Shoulder Flexion 125 Degrees   Right Shoulder ABduction 148 Degrees   Right Shoulder Internal Rotation --  [facility]   Right Shoulder External Rotation --  FER to C7   Left  Shoulder Flexion 145 Degrees   Left Shoulder ABduction 151 Degrees   Left Shoulder Internal Rotation --  FIR [facility]   Left Shoulder External Rotation --  FER to C6     Strength   Strength Assessment Site Shoulder;Elbow;Forearm;Wrist;Hand   Right/Left Shoulder Right;Left   Right Shoulder Flexion 4-/5   Right Shoulder ABduction 4/5   Right Shoulder Internal Rotation 4/5   Right Shoulder External Rotation 4-/5  pain at elbow with resistance   Left Shoulder Flexion 4/5   Left Shoulder ABduction 4/5   Left Shoulder Internal Rotation 4/5   Left Shoulder External Rotation 4/5   Right/Left Elbow Right;Left   Right Elbow Flexion 4-/5   Right Elbow Extension 3+/5   Left Elbow Flexion 4/5   Left Elbow Extension 4-/5   Right/Left Forearm Right;Left   Right Forearm  Pronation 4+/5   Right Forearm Supination 4+/5   Left Forearm Pronation 4+/5   Left Forearm Supination 4+/5   Right/Left Wrist Right;Left   Right Wrist Flexion 4+/5   Right Wrist Extension 4+/5   Right Wrist Radial Deviation 4+/5   Right Wrist Ulnar Deviation 4+/5   Left Wrist Flexion 4+/5   Left Wrist Extension 4+/5   Left Wrist Radial Deviation 4+/5   Left Wrist Ulnar Deviation 4+/5   Right/Left hand Right;Left   Right Hand Grip (lbs) 26, 24, 22   Left Hand Grip (lbs) 31, 25, 24     Palpation   Palpation comment ttp over R lateral epicondyle/wrist extensor group and R UT; pt denies ttp over R subacromial bursa/deltoids            Objective measurements completed on examination: See above findings.          [facility] Adult PT Treatment/Exercise - 11/21/16 1445      Elbow Exercises   Forearm Supination Right;10 reps;Bar weights/barbell;Strengthening   Forearm Supination Limitations 2# db held at end   Forearm Pronation Right;10 reps;Bar weights/barbell   Forearm Pronation Limitations 2# db held at end   Wrist Flexion Right;10 reps;Bar weights/barbell;Strengthening   Bar Weights/Barbell (Wrist Flexion) 2 lbs   Wrist Extension Right;10 reps     Shoulder Exercises: Seated   Retraction Both;10 reps   Retraction Limitations + scap depression, 5" hold     Shoulder Exercises: IT sales professional 30 seconds;1 rep   Corner Stretch Limitations 3 way doorway stretch      Wrist Exercises   Other wrist exercises R wrist extension stretch & B wrist flexion (prayer) stretch 2x30" each                PT Education - 11/21/16 1532    Education provided Yes   Education Details PT eval findings, anticipated POC & initial HEP   Person(s) Educated Patient   Methods Explanation;Demonstration;Handout   Comprehension Verbalized understanding;Returned demonstration;Need further instruction          PT Short Term Goals - 11/21/16 1532      PT SHORT TERM GOAL #1    Title Independent with initial HEP   Status New   Target Date 12/19/16     PT SHORT TERM GOAL #2   Title R shoulder AROM increased by 10 degrees w/o increased pain   Status New   Target Date 12/19/16     PT SHORT TERM GOAL #3   Title R shoulder & elbow strength increased by 1/2 grade   Status New   Target Date 12/19/16  PT Long Term Goals - 11/21/16 1533      PT LONG TERM GOAL #1   Title TBD if further therapy indicated after initial Medicaid authorization period                Plan - 11/21/16 1532    Clinical Impression Statement Elizabeth Rich is a 56 y/o female referred to OP PT for R subacromial bursitis and R lateral epicondylitis of nearly 2 years duration, originating in Dec 2016 after sleeping on R side on air mattress for 3 weeks and doing a lot of lifting and carrying while she was helping her niece move. Initially saw MD in 06/2015 and received the first of 3 rounds of injections to the R subacromial bursa and R common extensor tendon over increasing shorter intervals, with most recent round of injections on 11/04/16. She was seen for 1x visit per Medicaid in June 2017 and provided with a HEP for these problems at that time. Pt presents to PT w/o pain today, but ttp noted over R lateral epicondyle/wrist extensor group and R UT. Assessment revealed B UE ROM WFL but slightly limited shoulder flexion on R (125 dg) as compared to L (145 dg). Pt is R hand dominant but R elbow and shoulder strength only 3+/5 to 4-/5 as compared to 4/5 on L, with R grip strength slight weaker than R on average. Postural assessment reveals forward head and rounded shoulder posture. Weakness limits pt's functional use of R UE with lifting or carrying objects in R hand. Elizabeth Rich demonstrates good potential to benefit from skilled PT to improve postural awareness including increasing muscle flexibility along with soft tissue pliability, R shoulder ROM, scapular and RTC strengthening/stabilization, along  with forearm and wrist strengthening, utilizing modalities as needed for pain management.   History and Personal Factors relevant to plan of care: h/o noncompliance with prior HEP for same issues; obesity   Clinical Presentation Stable   Clinical Presentation due to: chronicity of problem w/o acute exacerbation at present   Clinical Decision Making Low   Rehab Potential Good   Clinical Impairments Affecting Rehab Potential obesity   PT Frequency 1x / week   PT Duration 4 weeks   PT Treatment/Interventions Patient/family education;Neuromuscular re-education;ADLs/Self Care Home Management;Therapeutic exercise;Therapeutic activities;Manual techniques;Dry needling;Taping;Electrical Stimulation;Cryotherapy;Moist Heat;Iontophoresis 68m/ml Dexamethasone   Consulted and Agree with Plan of Care Patient      Patient will benefit from skilled therapeutic intervention in order to improve the following deficits and impairments:  Postural dysfunction, Decreased range of motion, Decreased strength, Impaired UE functional use, Increased muscle spasms, Pain  Visit Diagnosis: Pain in right elbow  Acute pain of right shoulder  Abnormal posture     Problem List Patient Active Problem List   Diagnosis Date Noted  . Neck pain 09/21/2016  . Primary osteoarthritis of both knees 08/19/2016  . Subacromial bursitis 06/26/2015  . Lateral epicondylitis of right elbow 06/26/2015  . Right wrist pain 06/26/2015    JPercival Spanish PT, MPT 11/21/2016, 8:35 PM  [facility] [place] [address]Phone: [phone]  Fax:  [phone] Name: MLillee MooneyhanMRN: [id number] of Birth: 306-Feb-1962 PHYSICAL THERAPY DISCHARGE SUMMARY  Visits from Start of Care: 1  Current functional level related to goals / functional outcomes:   Refer to above eval as pt failed to return for any f/u visits despite Medicaid approval for 3  visits for initial 30 days,  with potential for further approval as indicated.   Remaining deficits:   As above.   Education / Equipment:   HEP  Plan: Patient agrees to discharge.  Patient goals were not met. Patient is being discharged due to not returning since the last visit.  ?????     Percival Spanish, PT, MPT 01/09/17, 11:25 AM  [facility] [address]  [place], [place], [address] Phone: [phone]   Fax:  [phone]

## 2017-03-30 ENCOUNTER — Ambulatory Visit: Payer: Medicaid Other | Admitting: Sports Medicine

## 2017-03-30 ENCOUNTER — Encounter: Payer: Self-pay | Admitting: Sports Medicine

## 2017-03-30 DIAGNOSIS — M7711 Lateral epicondylitis, right elbow: Secondary | ICD-10-CM

## 2017-03-30 DIAGNOSIS — M7550 Bursitis of unspecified shoulder: Secondary | ICD-10-CM

## 2017-03-30 NOTE — Progress Notes (Signed)
Subjective:    CC: Shoulder and elbow pain  HPI: Right subacromial bursitis: Has had a few injections, persistent recurrence of pain but really has not done any therapy, pain is over the deltoid, moderate, persistent without radiation.  Right lateral epicondylitis: Has had several injections as well, the tendon did look somewhat diminutive on the last ultrasound so recommended that we avoid any more steroid injections, I did offer PRP.  Ultimately she also just needs to do some physical therapy.  I reviewed the past medical history, family history, social history, surgical history, and allergies today and no changes were needed.  Please see the problem list section below in epic for further details.  Past Medical History: No past medical history on file. Past Surgical History: History reviewed. No pertinent surgical history. Social History: Social History   Socioeconomic History  . Marital status: Unknown    Spouse name: None  . Number of children: None  . Years of education: None  . Highest education level: None  Social Needs  . Financial resource strain: None  . Food insecurity - worry: None  . Food insecurity - inability: None  . Transportation needs - medical: None  . Transportation needs - non-medical: None  Occupational History  . None  Tobacco Use  . Smoking status: Never Smoker  . Smokeless tobacco: Never Used  Substance and Sexual Activity  . Alcohol use: No  . Drug use: No  . Sexual activity: None  Other Topics Concern  . None  Social History Narrative  . None   Family History: No family history on file. Allergies: Allergies  Allergen Reactions  . Benzocaine Swelling  . Lidocaine Swelling  . Gluten Meal Diarrhea  . Codeine Palpitations   Medications: See med rec.  Review of Systems: No fevers, chills, night sweats, weight loss, chest pain, or shortness of breath.   Objective:    General: Well Developed, well nourished, and in no acute distress.    Neuro: Alert and oriented x3, extra-ocular muscles intact, sensation grossly intact.  HEENT: Normocephalic, atraumatic, pupils equal round reactive to light, neck supple, no masses, no lymphadenopathy, thyroid nonpalpable.  Skin: Warm and dry, no rashes. Cardiac: Regular rate and rhythm, no murmurs rubs or gallops, no lower extremity edema.  Respiratory: Clear to auscultation bilaterally. Not using accessory muscles, speaking in full sentences. Right shoulder: Inspection reveals no abnormalities, atrophy or asymmetry. Palpation is normal with no tenderness over AC joint or bicipital groove. ROM is full in all planes. Rotator cuff strength normal throughout. Positive Neer and Hawkin's tests, empty can. Speeds and Yergason's tests normal. No labral pathology noted with negative Obrien's, negative crank, negative clunk, and good stability. Normal scapular function observed. No painful arc and no drop arm sign. No apprehension sign Right elbow: Unremarkable to inspection. Range of motion full pronation, supination, flexion, extension. Strength is full to all of the above directions Stable to varus, valgus stress. Negative moving valgus stress test. Tender to palpation of the common extensor tendon origin Ulnar nerve does not sublux. Negative cubital tunnel Tinel's.  Impression and Recommendations:    Lateral epicondylitis of right elbow We've done multiple steroid injections in the past. The tendon did appear diminutive on a previous injection ultrasound. My recommendation  to her is to no longer do steroid injections, I am happy to do a PRP procedure, but initially I think we should proceed with aggressive formal physical therapy.  Subacromial bursitis Same as below, avoiding steroid injections for now. Last one  was 4 months ago. She is really never done any therapy, I told her we would stop giving her the easy way out at this point. Adding some topical Pennsaid (diclofenac 2%  topical) for relief.  ___________________________________________ Ihor Austin. Benjamin Stain, M.D., ABFM., CAQSM. Primary Care and Sports Medicine [facility]  Adjunct Instructor of Family Medicine  [employer]

## 2017-03-30 NOTE — Assessment & Plan Note (Signed)
We've done multiple steroid injections in the past. The tendon did appear diminutive on a previous injection ultrasound. My recommendation  to her is to no longer do steroid injections, I am happy to do a PRP procedure, but initially I think we should proceed with aggressive formal physical therapy.

## 2017-03-30 NOTE — Patient Instructions (Signed)
Look into generics first, phentermine, topiramate. The branded agents Saxenda, Contrave can also be useful.

## 2017-03-30 NOTE — Assessment & Plan Note (Signed)
Same as below, avoiding steroid injections for now. Last one was 4 months ago. She is really never done any therapy, I told her we would stop giving her the easy way out at this point. Adding some topical Pennsaid (diclofenac 2% topical) for relief.

## 2017-04-18 ENCOUNTER — Ambulatory Visit: Payer: Medicaid Other | Admitting: Physical Therapy

## 2017-04-20 ENCOUNTER — Ambulatory Visit: Payer: Medicaid Other | Admitting: Sports Medicine

## 2017-04-20 ENCOUNTER — Encounter: Payer: Self-pay | Admitting: Sports Medicine

## 2017-04-20 DIAGNOSIS — M7711 Lateral epicondylitis, right elbow: Secondary | ICD-10-CM | POA: Diagnosis not present

## 2017-04-20 DIAGNOSIS — M170 Bilateral primary osteoarthritis of knee: Secondary | ICD-10-CM | POA: Diagnosis not present

## 2017-04-20 NOTE — Progress Notes (Signed)
Subjective:    CC: Follow-up  HPI: Bilateral tennis elbow, shoulder pain, bilateral knee osteoarthritis: We have done several injections into multiple structures, I told her at the last visit that we would have to work more with therapy and rehabilitation rather than any more interventions, she has had enough steroid injections already.  She tells me that she cannot take any medicines for pain, no NSAIDs, no narcotics, cannot take Tylenol.  She also tells me that she forgot to use the topical Pennsaid (diclofenac 2% topical) samples that I provided her at the last visit, and that she was too busy to apply the topical Pennsaid (diclofenac 2% topical).  She also tells me that she is unable to do any kind of therapy.  She realizes that at this point we are had an impasse regarding treating her.  I reviewed the past medical history, family history, social history, surgical history, and allergies today and no changes were needed.  Please see the problem list section below in epic for further details.  Past Medical History: No past medical history on file. Past Surgical History: No past surgical history on file. Social History: Social History   Socioeconomic History  . Marital status: Unknown    Spouse name: Not on file  . Number of children: Not on file  . Years of education: Not on file  . Highest education level: Not on file  Occupational History  . Not on file  Social Needs  . Financial resource strain: Not on file  . Food insecurity:    Worry: Not on file    Inability: Not on file  . Transportation needs:    Medical: Not on file    Non-medical: Not on file  Tobacco Use  . Smoking status: Never Smoker  . Smokeless tobacco: Never Used  Substance and Sexual Activity  . Alcohol use: No  . Drug use: No  . Sexual activity: Not on file  Lifestyle  . Physical activity:    Days per week: Not on file    Minutes per session: Not on file  . Stress: Not on file  Relationships  .  Social connections:    Talks on phone: Not on file    Gets together: Not on file    Attends religious service: Not on file    Active member of club or organization: Not on file    Attends meetings of clubs or organizations: Not on file    Relationship status: Not on file  Other Topics Concern  . Not on file  Social History Narrative  . Not on file   Family History: No family history on file. Allergies: Allergies  Allergen Reactions  . Benzocaine Swelling  . Lidocaine Swelling  . Gluten Meal Diarrhea  . Codeine Palpitations   Medications: See med rec.  Review of Systems: No fevers, chills, night sweats, weight loss, chest pain, or shortness of breath.   Objective:    General: Well Developed, well nourished, and in no acute distress.  Neuro: Alert and oriented x3, extra-ocular muscles intact, sensation grossly intact.  HEENT: Normocephalic, atraumatic, pupils equal round reactive to light, neck supple, no masses, no lymphadenopathy, thyroid nonpalpable.  Skin: Warm and dry, no rashes. Cardiac: Regular rate and rhythm, no murmurs rubs or gallops, no lower extremity edema.  Respiratory: Clear to auscultation bilaterally. Not using accessory muscles, speaking in full sentences.  Impression and Recommendations:    Primary osteoarthritis of both knees Bilateral reaction knee braces.   We are  avoiding steroid injections. Rehab exercises given.  Lateral epicondylitis of right elbow Bilateral. Unable to afford PRP, I have also told her we are going to avoid steroid injections, we have done several over the past several months. She did finally get a job, after a month she will have insurance and will be able to do some physical therapy. Declines all medications for pain, she has not yet tried topical Pennsaid (diclofenac 2% topical).  I spent 25 minutes with this patient, greater than 50% was face-to-face time counseling regarding the above  diagnoses ___________________________________________ Ihor Austin. Benjamin Stain, M.D., ABFM., CAQSM. Primary Care and Sports Medicine [facility]  Adjunct Instructor of Family Medicine  [employer]

## 2017-04-20 NOTE — Assessment & Plan Note (Signed)
Bilateral reaction knee braces.   We are avoiding steroid injections. Rehab exercises given.

## 2017-04-20 NOTE — Assessment & Plan Note (Signed)
Bilateral. Unable to afford PRP, I have also told her we are going to avoid steroid injections, we have done several over the past several months. She did finally get a job, after a month she will have insurance and will be able to do some physical therapy. Declines all medications for pain, she has not yet tried topical Pennsaid (diclofenac 2% topical).

## 2017-04-21 ENCOUNTER — Telehealth: Payer: Self-pay

## 2017-04-21 NOTE — Telephone Encounter (Signed)
Pt left VM stating she had severe itching and burning where she applied the topical pennsaid and had to wipe it off within 30 minutes of applying. Would like to know if there anything else topical that she can use.

## 2017-04-21 NOTE — Telephone Encounter (Signed)
It may burn some with application, just do it again and think about something else, push through it.

## 2017-09-06 ENCOUNTER — Ambulatory Visit: Payer: Medicaid Other | Admitting: Sports Medicine

## 2017-09-06 ENCOUNTER — Encounter: Payer: Self-pay | Admitting: Sports Medicine

## 2017-09-06 ENCOUNTER — Ambulatory Visit (INDEPENDENT_AMBULATORY_CARE_PROVIDER_SITE_OTHER): Payer: Medicaid Other

## 2017-09-06 DIAGNOSIS — M25521 Pain in right elbow: Secondary | ICD-10-CM

## 2017-09-06 DIAGNOSIS — Z96611 Presence of right artificial shoulder joint: Secondary | ICD-10-CM

## 2017-09-06 DIAGNOSIS — M7711 Lateral epicondylitis, right elbow: Secondary | ICD-10-CM | POA: Diagnosis not present

## 2017-09-06 DIAGNOSIS — M7550 Bursitis of unspecified shoulder: Secondary | ICD-10-CM | POA: Diagnosis not present

## 2017-09-06 DIAGNOSIS — G8929 Other chronic pain: Secondary | ICD-10-CM | POA: Diagnosis not present

## 2017-09-06 DIAGNOSIS — M7551 Bursitis of right shoulder: Secondary | ICD-10-CM

## 2017-09-06 NOTE — Progress Notes (Signed)
Subjective:    CC: Shoulder and elbow pain  HPI: Elizabeth Rich is a pleasant 57 year old female, she is here for follow-up of shoulder and elbow pain, she has had a subacromial injection along time ago, good relief, now having a recurrence of pain over the deltoid, moderate, persistent without radiation, she tells me she is doing her rehab exercises.  Lateral epicondylitis: Right-sided, has failed multiple injections, therapy, bracing.  Up until recently has not been able to afford PRP.  I reviewed the past medical history, family history, social history, surgical history, and allergies today and no changes were needed.  Please see the problem list section below in epic for further details.  Past Medical History: No past medical history on file. Past Surgical History: No past surgical history on file. Social History: Social History   Socioeconomic History  . Marital status: Unknown    Spouse name: Not on file  . Number of children: Not on file  . Years of education: Not on file  . Highest education level: Not on file  Occupational History  . Not on file  Social Needs  . Financial resource strain: Not on file  . Food insecurity:    Worry: Not on file    Inability: Not on file  . Transportation needs:    Medical: Not on file    Non-medical: Not on file  Tobacco Use  . Smoking status: Never Smoker  . Smokeless tobacco: Never Used  Substance and Sexual Activity  . Alcohol use: No  . Drug use: No  . Sexual activity: Not on file  Lifestyle  . Physical activity:    Days per week: Not on file    Minutes per session: Not on file  . Stress: Not on file  Relationships  . Social connections:    Talks on phone: Not on file    Gets together: Not on file    Attends religious service: Not on file    Active member of club or organization: Not on file    Attends meetings of clubs or organizations: Not on file    Relationship status: Not on file  Other Topics Concern  . Not on file    Social History Narrative  . Not on file   Family History: No family history on file. Allergies: Allergies  Allergen Reactions  . Benzocaine Swelling  . Lidocaine Swelling  . Gluten Meal Diarrhea  . Codeine Palpitations   Medications: See med rec.  Review of Systems: No fevers, chills, night sweats, weight loss, chest pain, or shortness of breath.   Objective:    General: Well Developed, well nourished, and in no acute distress.  Neuro: Alert and oriented x3, extra-ocular muscles intact, sensation grossly intact.  HEENT: Normocephalic, atraumatic, pupils equal round reactive to light, neck supple, no masses, no lymphadenopathy, thyroid nonpalpable.  Skin: Warm and dry, no rashes. Cardiac: Regular rate and rhythm, no murmurs rubs or gallops, no lower extremity edema.  Respiratory: Clear to auscultation bilaterally. Not using accessory muscles, speaking in full sentences. Right shoulder: Inspection reveals no abnormalities, atrophy or asymmetry. Palpation is normal with no tenderness over AC joint or bicipital groove. ROM is full in all planes. Rotator cuff strength normal throughout. Positive Neer and Hawkin's tests, empty can. Speeds and Yergason's tests normal. No labral pathology noted with negative Obrien's, negative crank, negative clunk, and good stability. Normal scapular function observed. No painful arc and no drop arm sign. No apprehension sign  Procedure: Real-time Ultrasound Guided Injection of  right subacromial bursa Device: GE Logiq E  Verbal informed consent obtained.  Time-out conducted.  Noted no overlying erythema, induration, or other signs of local infection.  Skin prepped in a sterile fashion.  Local anesthesia: Topical Ethyl chloride.  With sterile technique and under real time ultrasound guidance: 1 cc Kenalog 40, 1 cc lidocaine, 1 cc bupivacaine injected easily Completed without difficulty  Pain immediately resolved suggesting accurate placement  of the medication.  Advised to call if fevers/chills, erythema, induration, drainage, or persistent bleeding.  Images permanently stored and available for review in the ultrasound unit.  Impression: Technically successful ultrasound guided injection.  Impression and Recommendations:    Subacromial bursitis Right subacromial injection as above. Avoiding steroid injections into the elbow. Adding an x-ray and MRI for surgical planning.  Lateral epicondylitis of right elbow Right elbow MRI to confirm lateral epicondylitis, has had several injections, were avoiding steroid injections into the right elbow for now. PRP is the next step and she will let us know if she would like to proceed. ___________________________________________ Ihor Austinhomas J. Benjamin Stainhekkekandam, M.D., ABFM., CAQSM. Primary Care and Sports Medicine [facility]  Adjunct Instructor of Family Medicine  [employer]

## 2017-09-06 NOTE — Assessment & Plan Note (Signed)
Right elbow MRI to confirm lateral epicondylitis, has had several injections, were avoiding steroid injections into the right elbow for now. PRP is the next step and she will let us know if she would like to proceed.

## 2017-09-06 NOTE — Assessment & Plan Note (Signed)
Right subacromial injection as above. Avoiding steroid injections into the elbow. Adding an x-ray and MRI for surgical planning.

## 2017-10-03 ENCOUNTER — Other Ambulatory Visit: Payer: Medicaid Other

## 2017-10-03 ENCOUNTER — Other Ambulatory Visit: Payer: Self-pay

## 2017-12-13 ENCOUNTER — Ambulatory Visit: Payer: Medicaid Other | Admitting: Sports Medicine

## 2017-12-13 DIAGNOSIS — M7551 Bursitis of right shoulder: Secondary | ICD-10-CM | POA: Diagnosis not present

## 2017-12-13 DIAGNOSIS — M170 Bilateral primary osteoarthritis of knee: Secondary | ICD-10-CM

## 2017-12-13 MED ORDER — CELECOXIB 200 MG PO CAPS: 200.0000 mg | ORAL_CAPSULE | Freq: Every day | ORAL | 3 refills | Status: DC

## 2017-12-13 NOTE — Assessment & Plan Note (Signed)
Having some discomfort, we have not done injections in years. She typically gets epigastric discomfort with NSAIDs, adding Celebrex this time with meals. Tylenol is not effective, tramadol was not effective.

## 2017-12-13 NOTE — Progress Notes (Signed)
Subjective:    CC: Right shoulder pain  HPI: This is a pleasant 57 year old female, history of right subacromial bursitis, previous injection was approximately 3 months ago, now with recurrence of pain, moderate, persistent, localized over the deltoid and worse with overhead activities.  I reviewed the past medical history, family history, social history, surgical history, and allergies today and no changes were needed.  Please see the problem list section below in epic for further details.  Past Medical History: No past medical history on file. Past Surgical History: No past surgical history on file. Social History: Social History   Socioeconomic History  . Marital status: Unknown    Spouse name: Not on file  . Number of children: Not on file  . Years of education: Not on file  . Highest education level: Not on file  Occupational History  . Not on file  Social Needs  . Financial resource strain: Not on file  . Food insecurity:    Worry: Not on file    Inability: Not on file  . Transportation needs:    Medical: Not on file    Non-medical: Not on file  Tobacco Use  . Smoking status: Never Smoker  . Smokeless tobacco: Never Used  Substance and Sexual Activity  . Alcohol use: No  . Drug use: No  . Sexual activity: Not on file  Lifestyle  . Physical activity:    Days per week: Not on file    Minutes per session: Not on file  . Stress: Not on file  Relationships  . Social connections:    Talks on phone: Not on file    Gets together: Not on file    Attends religious service: Not on file    Active member of club or organization: Not on file    Attends meetings of clubs or organizations: Not on file    Relationship status: Not on file  Other Topics Concern  . Not on file  Social History Narrative  . Not on file   Family History: No family history on file. Allergies: Allergies  Allergen Reactions  . Benzocaine Swelling  . Lidocaine Swelling  . Gluten Meal  Diarrhea  . Codeine Palpitations   Medications: See med rec.  Review of Systems: No fevers, chills, night sweats, weight loss, chest pain, or shortness of breath.   Objective:    General: Well Developed, well nourished, and in no acute distress.  Neuro: Alert and oriented x3, extra-ocular muscles intact, sensation grossly intact.  HEENT: Normocephalic, atraumatic, pupils equal round reactive to light, neck supple, no masses, no lymphadenopathy, thyroid nonpalpable.  Skin: Warm and dry, no rashes. Cardiac: Regular rate and rhythm, no murmurs rubs or gallops, no lower extremity edema.  Respiratory: Clear to auscultation bilaterally. Not using accessory muscles, speaking in full sentences. Right shoulder: Inspection reveals no abnormalities, atrophy or asymmetry. Palpation is normal with no tenderness over AC joint or bicipital groove. ROM is full in all planes. Rotator cuff strength normal throughout. Positive Neer and Hawkin's tests, empty can. Speeds and Yergason's tests normal. No labral pathology noted with negative Obrien's, negative crank, negative clunk, and good stability. Normal scapular function observed. No painful arc and no drop arm sign. No apprehension sign  Procedure: Real-time Ultrasound Guided Injection of right subacromial bursa Device: GE Logiq E  Verbal informed consent obtained.  Time-out conducted.  Noted no overlying erythema, induration, or other signs of local infection.  Skin prepped in a sterile fashion.  Local anesthesia: Topical  Ethyl chloride.  With sterile technique and under real time ultrasound guidance:   1 cc kenalog 40, 1 cc lidocaine, 1 cc bupivacaine just to the Completed without difficulty  Pain immediately resolved suggesting accurate placement of the medication.  Advised to call if fevers/chills, erythema, induration, drainage, or persistent bleeding.  Images permanently stored and available for review in the ultrasound unit.  Impression:  Technically successful ultrasound guided injection.  Impression and Recommendations:    Subacromial bursitis Right subacromial bursa injection, previous injection was in August.   Primary osteoarthritis of both knees Having some discomfort, we have not done injections in years. She typically gets epigastric discomfort with NSAIDs, adding Celebrex this time with meals. Tylenol is not effective, tramadol was not effective. ___________________________________________ Ihor Austin. Benjamin Stain, M.D., ABFM., CAQSM. Primary Care and Sports Medicine [facility]  Adjunct Professor of Family Medicine  [employer]

## 2017-12-13 NOTE — Assessment & Plan Note (Signed)
Right subacromial bursa injection, previous injection was in August.

## 2018-02-23 ENCOUNTER — Ambulatory Visit: Payer: Self-pay | Admitting: Physician Assistant

## 2018-03-01 ENCOUNTER — Ambulatory Visit: Payer: Medicaid Other | Admitting: Sports Medicine

## 2018-03-01 ENCOUNTER — Encounter: Payer: Self-pay | Admitting: Sports Medicine

## 2018-03-01 DIAGNOSIS — M7551 Bursitis of right shoulder: Secondary | ICD-10-CM

## 2018-03-01 DIAGNOSIS — M170 Bilateral primary osteoarthritis of knee: Secondary | ICD-10-CM | POA: Diagnosis not present

## 2018-03-01 NOTE — Assessment & Plan Note (Signed)
Persistent pain after multiple injections, a bit of therapy. She has had x-rays in the past that were unrevealing. Adding an MRI, this needs to be done tomorrow at a [facility] facility.

## 2018-03-01 NOTE — Assessment & Plan Note (Signed)
Bilateral injections as above.

## 2018-03-01 NOTE — Progress Notes (Signed)
Subjective:    CC: Shoulder pain, knee pain  HPI: This is a pleasant 58 year old female, for the past several months she has had pain in both knees, anterior aspect, moderate, persistent, worse going up and down stairs, significant morning stiffness, no mechanical symptoms, no trauma.  She tells me that she cannot take NSAIDs.  In addition she has had right shoulder pain for some time now, she is had a few injections, the most recent of which was 2-1/2 months ago.  I had told her at the last visit that if she does not get at least 3 to 4 months of relief we will proceed with an MRI, in fact I tried to order an MRI sometime ago.  I reviewed the past medical history, family history, social history, surgical history, and allergies today and no changes were needed.  Please see the problem list section below in epic for further details.  Past Medical History: No past medical history on file. Past Surgical History: No past surgical history on file. Social History: Social History   Socioeconomic History  . Marital status: Unknown    Spouse name: Not on file  . Number of children: Not on file  . Years of education: Not on file  . Highest education level: Not on file  Occupational History  . Not on file  Social Needs  . Financial resource strain: Not on file  . Food insecurity:    Worry: Not on file    Inability: Not on file  . Transportation needs:    Medical: Not on file    Non-medical: Not on file  Tobacco Use  . Smoking status: Never Smoker  . Smokeless tobacco: Never Used  Substance and Sexual Activity  . Alcohol use: No  . Drug use: No  . Sexual activity: Not on file  Lifestyle  . Physical activity:    Days per week: Not on file    Minutes per session: Not on file  . Stress: Not on file  Relationships  . Social connections:    Talks on phone: Not on file    Gets together: Not on file    Attends religious service: Not on file    Active member of club or organization:  Not on file    Attends meetings of clubs or organizations: Not on file    Relationship status: Not on file  Other Topics Concern  . Not on file  Social History Narrative  . Not on file   Family History: No family history on file. Allergies: Allergies  Allergen Reactions  . Benzocaine Swelling  . Lidocaine Swelling  . Gluten Meal Diarrhea  . Codeine Palpitations   Medications: See med rec.  Review of Systems: No fevers, chills, night sweats, weight loss, chest pain, or shortness of breath.   Objective:    General: Well Developed, well nourished, and in no acute distress.  Neuro: Alert and oriented x3, extra-ocular muscles intact, sensation grossly intact.  HEENT: Normocephalic, atraumatic, pupils equal round reactive to light, neck supple, no masses, no lymphadenopathy, thyroid nonpalpable.  Skin: Warm and dry, no rashes. Cardiac: Regular rate and rhythm, no murmurs rubs or gallops, no lower extremity edema.  Respiratory: Clear to auscultation bilaterally. Not using accessory muscles, speaking in full sentences. Bilateral knees: Normal to inspection with no erythema or effusion or obvious bony abnormalities. Tender to palpation at the patellar facets. ROM normal in flexion and extension and lower leg rotation. Ligaments with solid consistent endpoints including ACL, PCL,  LCL, MCL. Negative Mcmurray's and provocative meniscal tests. Non painful patellar compression. Patellar and quadriceps tendons unremarkable. Hamstring and quadriceps strength is normal.  Procedure: Real-time Ultrasound Guided Injection of left knee Device: GE Logiq E  Verbal informed consent obtained.   Time-out conducted.  Noted no overlying erythema, induration, or other signs of local infection.  Skin prepped in a sterile fashion.  Local anesthesia: Topical Ethyl chloride.  With sterile technique and under real time ultrasound guidance: 1 cc Kenalog 40, 2 cc lidocaine, 2 cc bupivacaine injected  easily Completed without difficulty  Pain immediately resolved suggesting accurate placement of the medication.  Advised to call if fevers/chills, erythema, induration, drainage, or persistent bleeding.  Images permanently stored and available for review in the ultrasound unit.  Impression: Technically successful ultrasound guided injection.  Procedure: Real-time Ultrasound Guided Injection of right knee Device: GE Logiq E  Verbal informed consent obtained.  Time-out conducted.  Noted no overlying erythema, induration, or other signs of local infection.  Skin prepped in a sterile fashion.  Local anesthesia: Topical Ethyl chloride.  With sterile technique and under real time ultrasound guidance: 1 cc Kenalog 40, 2 cc lidocaine, 2 cc bupivacaine injected easily Completed without difficulty  Pain immediately resolved suggesting accurate placement of the medication.  Advised to call if fevers/chills, erythema, induration, drainage, or persistent bleeding.  Images permanently stored and available for review in the ultrasound unit.  Impression: Technically successful ultrasound guided injection.  Impression and Recommendations:    Primary osteoarthritis of both knees Bilateral injections as above.  Subacromial bursitis Persistent pain after multiple injections, a bit of therapy. She has had x-rays in the past that were unrevealing. Adding an MRI, this needs to be done tomorrow at a [facility] facility. ___________________________________________ Ihor Austin. Benjamin Stain, M.D., ABFM., CAQSM. Primary Care and Sports Medicine [facility]  Adjunct Professor of Family Medicine  [employer]

## 2018-03-02 ENCOUNTER — Encounter: Payer: Self-pay | Admitting: Sports Medicine

## 2018-04-13 ENCOUNTER — Encounter: Payer: Self-pay | Admitting: Sports Medicine

## 2018-04-13 ENCOUNTER — Other Ambulatory Visit: Payer: Self-pay

## 2018-04-13 ENCOUNTER — Ambulatory Visit (INDEPENDENT_AMBULATORY_CARE_PROVIDER_SITE_OTHER): Payer: BC Managed Care – PPO | Admitting: Sports Medicine

## 2018-04-13 DIAGNOSIS — M75121 Complete rotator cuff tear or rupture of right shoulder, not specified as traumatic: Secondary | ICD-10-CM

## 2018-04-13 NOTE — Patient Instructions (Signed)
1.  Dr. Ramond Marrow 2.  Dr. Jones Broom 3. [employer]

## 2018-04-13 NOTE — Assessment & Plan Note (Signed)
Full-thickness retracted supraspinatus tear. She is requesting an injection today, I have advised her that we will not be doing this. She did tell me that she would just find another doctor who would do the injection. I explained the risk of fatty atrophy, and ending up with an unrepairable rotator cuff, and subsequent rotator cuff tear arthropathy and the need for reverse shoulder arthroplasty. I think ultimately at the end of the visit we convinced her to at least have a consultation with 1 of our shoulder surgeons. Continue Celebrex. Rehab exercises given to keep her mobility, return to see me as needed. She does have an MRI disc and will take it for his review.

## 2018-04-13 NOTE — Progress Notes (Signed)
Subjective:    CC: Right shoulder pain  HPI: Byrd Hesselbach returns, she is a pleasant 58 year old female right shoulder pain, we have done a few injections but more recently after failure of success, and physical therapy we obtained an MRI.  Results of which will be dictated below.  She is asking for another injection today.  I reviewed the past medical history, family history, social history, surgical history, and allergies today and no changes were needed.  Please see the problem list section below in epic for further details.  Past Medical History: No past medical history on file. Past Surgical History: No past surgical history on file. Social History: Social History   Socioeconomic History  . Marital status: Unknown    Spouse name: Not on file  . Number of children: Not on file  . Years of education: Not on file  . Highest education level: Not on file  Occupational History  . Not on file  Social Needs  . Financial resource strain: Not on file  . Food insecurity:    Worry: Not on file    Inability: Not on file  . Transportation needs:    Medical: Not on file    Non-medical: Not on file  Tobacco Use  . Smoking status: Never Smoker  . Smokeless tobacco: Never Used  Substance and Sexual Activity  . Alcohol use: No  . Drug use: No  . Sexual activity: Not on file  Lifestyle  . Physical activity:    Days per week: Not on file    Minutes per session: Not on file  . Stress: Not on file  Relationships  . Social connections:    Talks on phone: Not on file    Gets together: Not on file    Attends religious service: Not on file    Active member of club or organization: Not on file    Attends meetings of clubs or organizations: Not on file    Relationship status: Not on file  Other Topics Concern  . Not on file  Social History Narrative  . Not on file   Family History: No family history on file. Allergies: Allergies  Allergen Reactions  . Benzocaine Swelling  .  Lidocaine Swelling  . Gluten Meal Diarrhea  . Codeine Palpitations   Medications: See med rec.  Review of Systems: No fevers, chills, night sweats, weight loss, chest pain, or shortness of breath.   Objective:    General: Well Developed, well nourished, and in no acute distress.  Neuro: Alert and oriented x3, extra-ocular muscles intact, sensation grossly intact.  HEENT: Normocephalic, atraumatic, pupils equal round reactive to light, neck supple, no masses, no lymphadenopathy, thyroid nonpalpable.  Skin: Warm and dry, no rashes. Cardiac: Regular rate and rhythm, no murmurs rubs or gallops, no lower extremity edema.  Respiratory: Clear to auscultation bilaterally. Not using accessory muscles, speaking in full sentences. Right shoulder: Unable to abduction past approximately 15 degrees, significant weakness.  MRI report reviewed, she has a full-thickness, full width, mildly retracted supraspinatus tear.  No fatty atrophy discussed on the MRI report.  Impression and Recommendations:    Rotator cuff tear, right Full-thickness retracted supraspinatus tear. She is requesting an injection today, I have advised her that we will not be doing this. She did tell me that she would just find another doctor who would do the injection. I explained the risk of fatty atrophy, and ending up with an unrepairable rotator cuff, and subsequent rotator cuff tear arthropathy and the  need for reverse shoulder arthroplasty. I think ultimately at the end of the visit we convinced her to at least have a consultation with 1 of our shoulder surgeons. Continue Celebrex. Rehab exercises given to keep her mobility, return to see me as needed. She does have an MRI disc and will take it for his review.   ___________________________________________ Ihor Austin. Benjamin Stain, M.D., ABFM., CAQSM. Primary Care and Sports Medicine [facility]  Adjunct Professor of Family Medicine  [employer]

## 2018-04-20 ENCOUNTER — Other Ambulatory Visit: Payer: Self-pay | Admitting: Sports Medicine

## 2018-04-20 DIAGNOSIS — M170 Bilateral primary osteoarthritis of knee: Secondary | ICD-10-CM

## 2018-04-20 MED ORDER — CELECOXIB 200 MG PO CAPS: 200.0000 mg | ORAL_CAPSULE | Freq: Every day | ORAL | 3 refills | Status: DC

## 2018-05-03 DIAGNOSIS — J029 Acute pharyngitis, unspecified: Secondary | ICD-10-CM | POA: Diagnosis not present

## 2018-05-03 DIAGNOSIS — Z20828 Contact with and (suspected) exposure to other viral communicable diseases: Secondary | ICD-10-CM | POA: Diagnosis not present

## 2018-05-03 DIAGNOSIS — R05 Cough: Secondary | ICD-10-CM | POA: Diagnosis not present

## 2018-05-03 DIAGNOSIS — Z03818 Encounter for observation for suspected exposure to other biological agents ruled out: Secondary | ICD-10-CM | POA: Diagnosis not present

## 2018-05-03 DIAGNOSIS — R51 Headache: Secondary | ICD-10-CM | POA: Diagnosis not present

## 2018-05-09 DIAGNOSIS — J0100 Acute maxillary sinusitis, unspecified: Secondary | ICD-10-CM | POA: Diagnosis not present

## 2018-05-09 DIAGNOSIS — R05 Cough: Secondary | ICD-10-CM | POA: Diagnosis not present

## 2018-05-09 DIAGNOSIS — R51 Headache: Secondary | ICD-10-CM | POA: Diagnosis not present

## 2018-05-09 DIAGNOSIS — J029 Acute pharyngitis, unspecified: Secondary | ICD-10-CM | POA: Diagnosis not present

## 2018-08-14 DIAGNOSIS — G4733 Obstructive sleep apnea (adult) (pediatric): Secondary | ICD-10-CM | POA: Diagnosis not present

## 2018-10-17 DIAGNOSIS — G4733 Obstructive sleep apnea (adult) (pediatric): Secondary | ICD-10-CM | POA: Diagnosis not present

## 2019-01-22 ENCOUNTER — Other Ambulatory Visit: Payer: Self-pay | Admitting: Sports Medicine

## 2019-01-22 DIAGNOSIS — M170 Bilateral primary osteoarthritis of knee: Secondary | ICD-10-CM

## 2019-04-29 IMAGING — DX DG ELBOW COMPLETE 3+V*R*
4 series · 4 of 4 positions shown · non-contrast
Comparison: None.

CLINICAL DATA: Chronic pain.

EXAM:
RIGHT ELBOW - COMPLETE 3+ VIEW

[elbow ap]
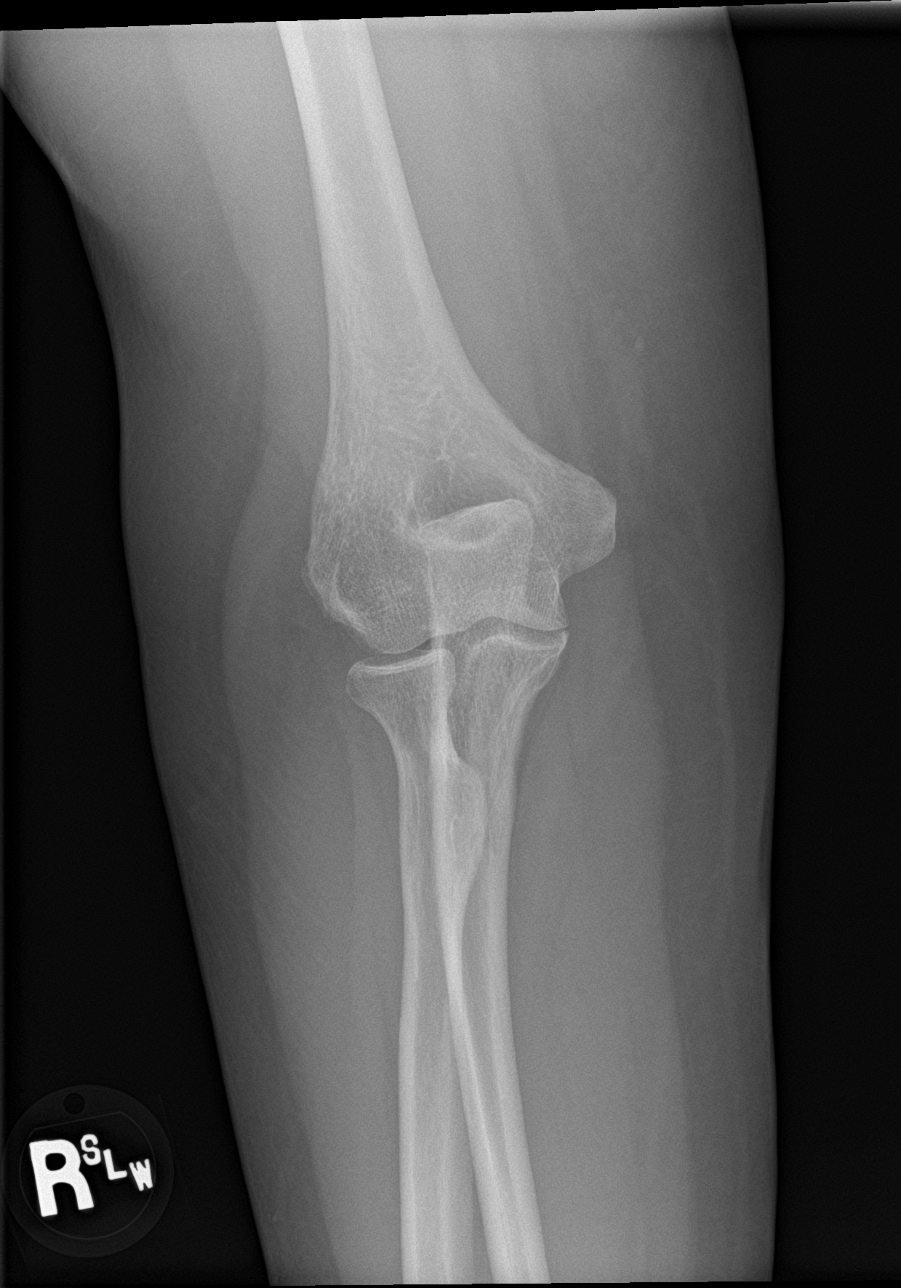

[elbow obl (1 of 2)]
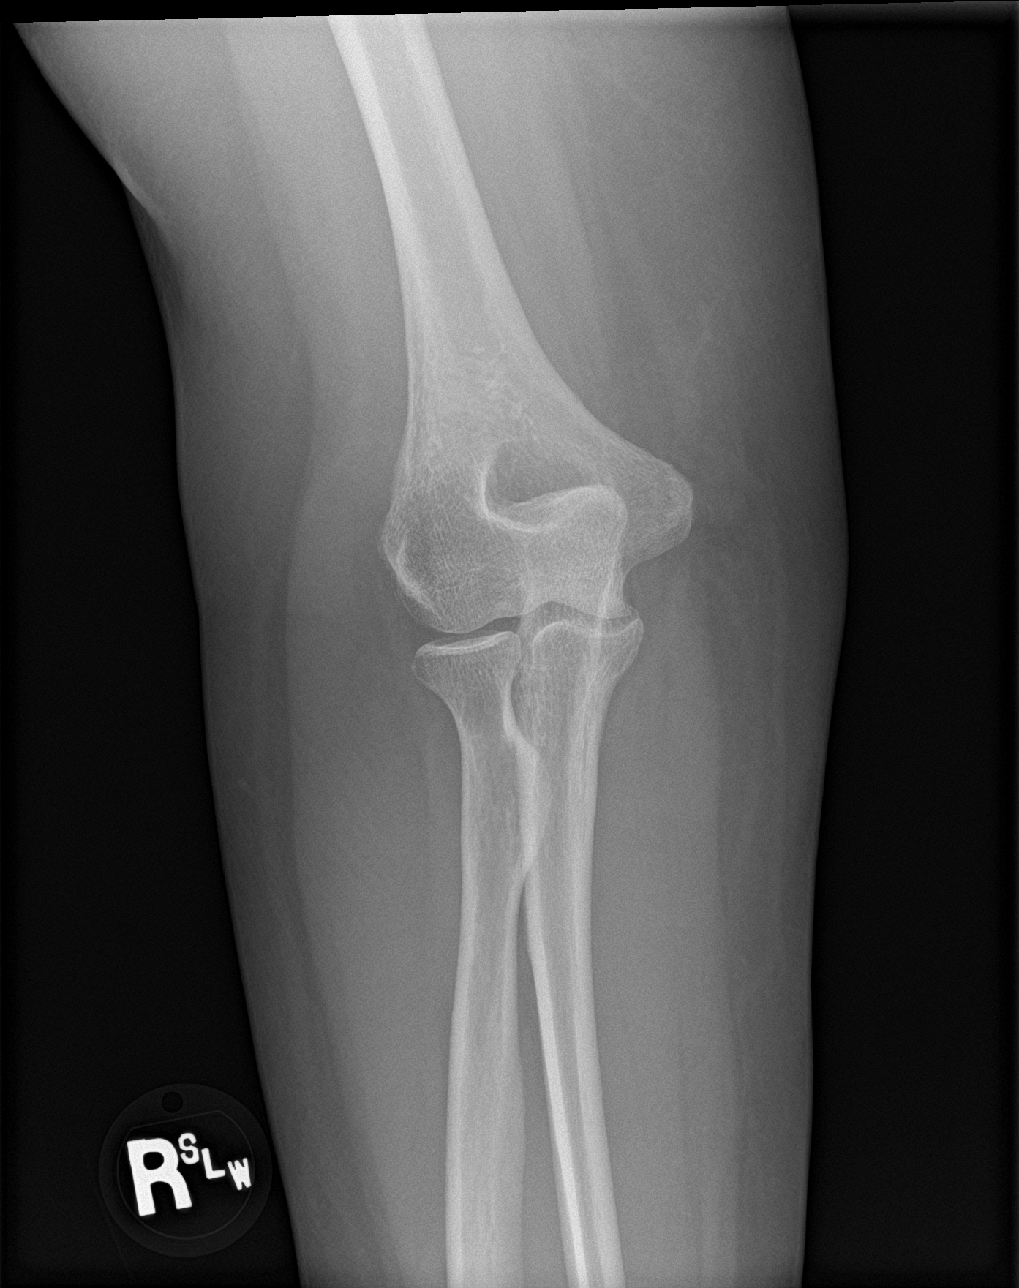

[elbow obl (2 of 2)]
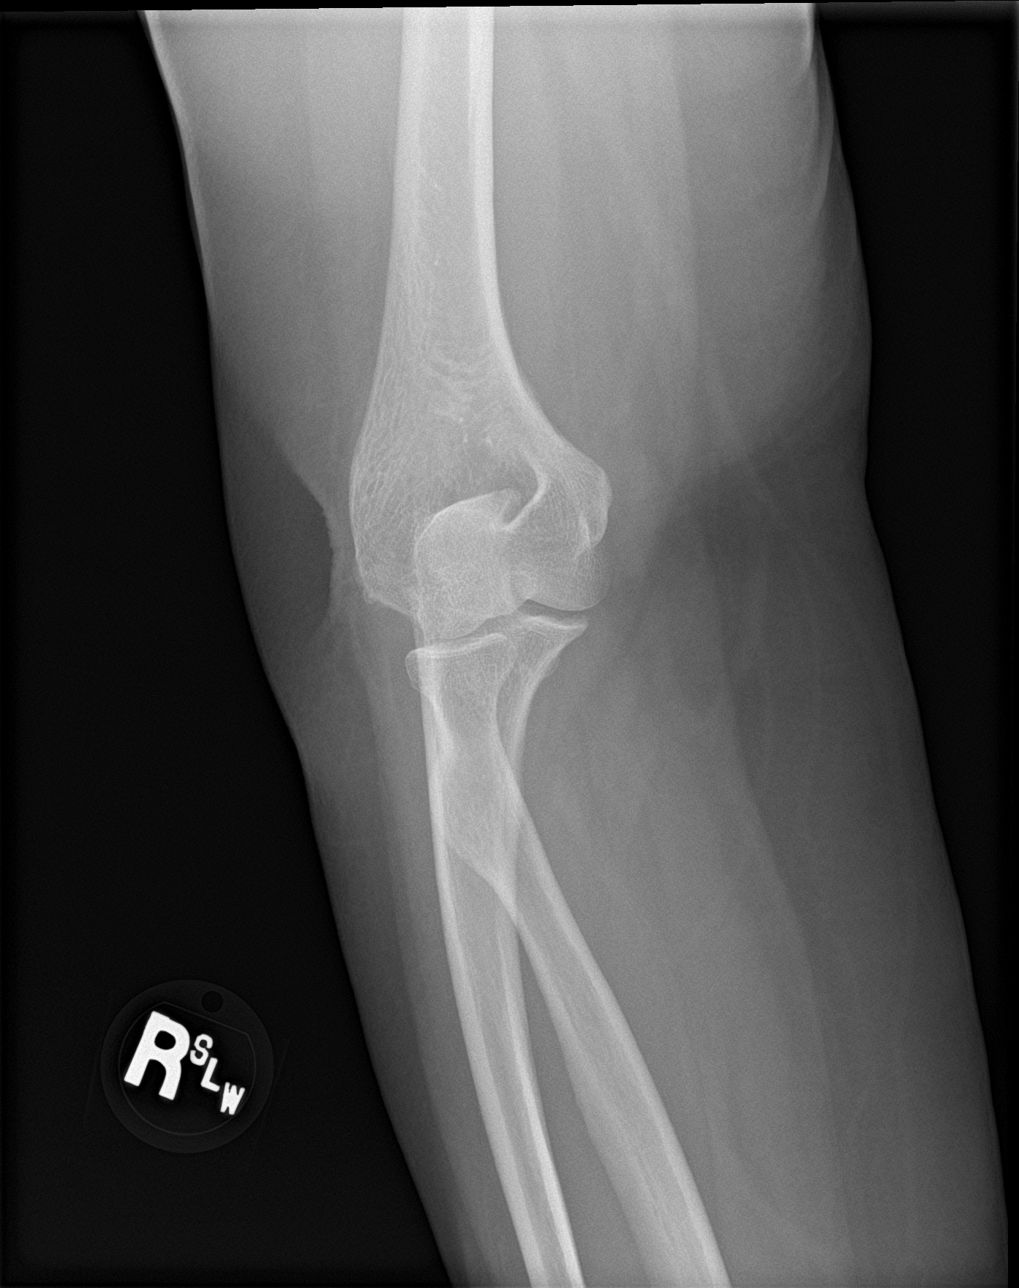

[elbow lat]
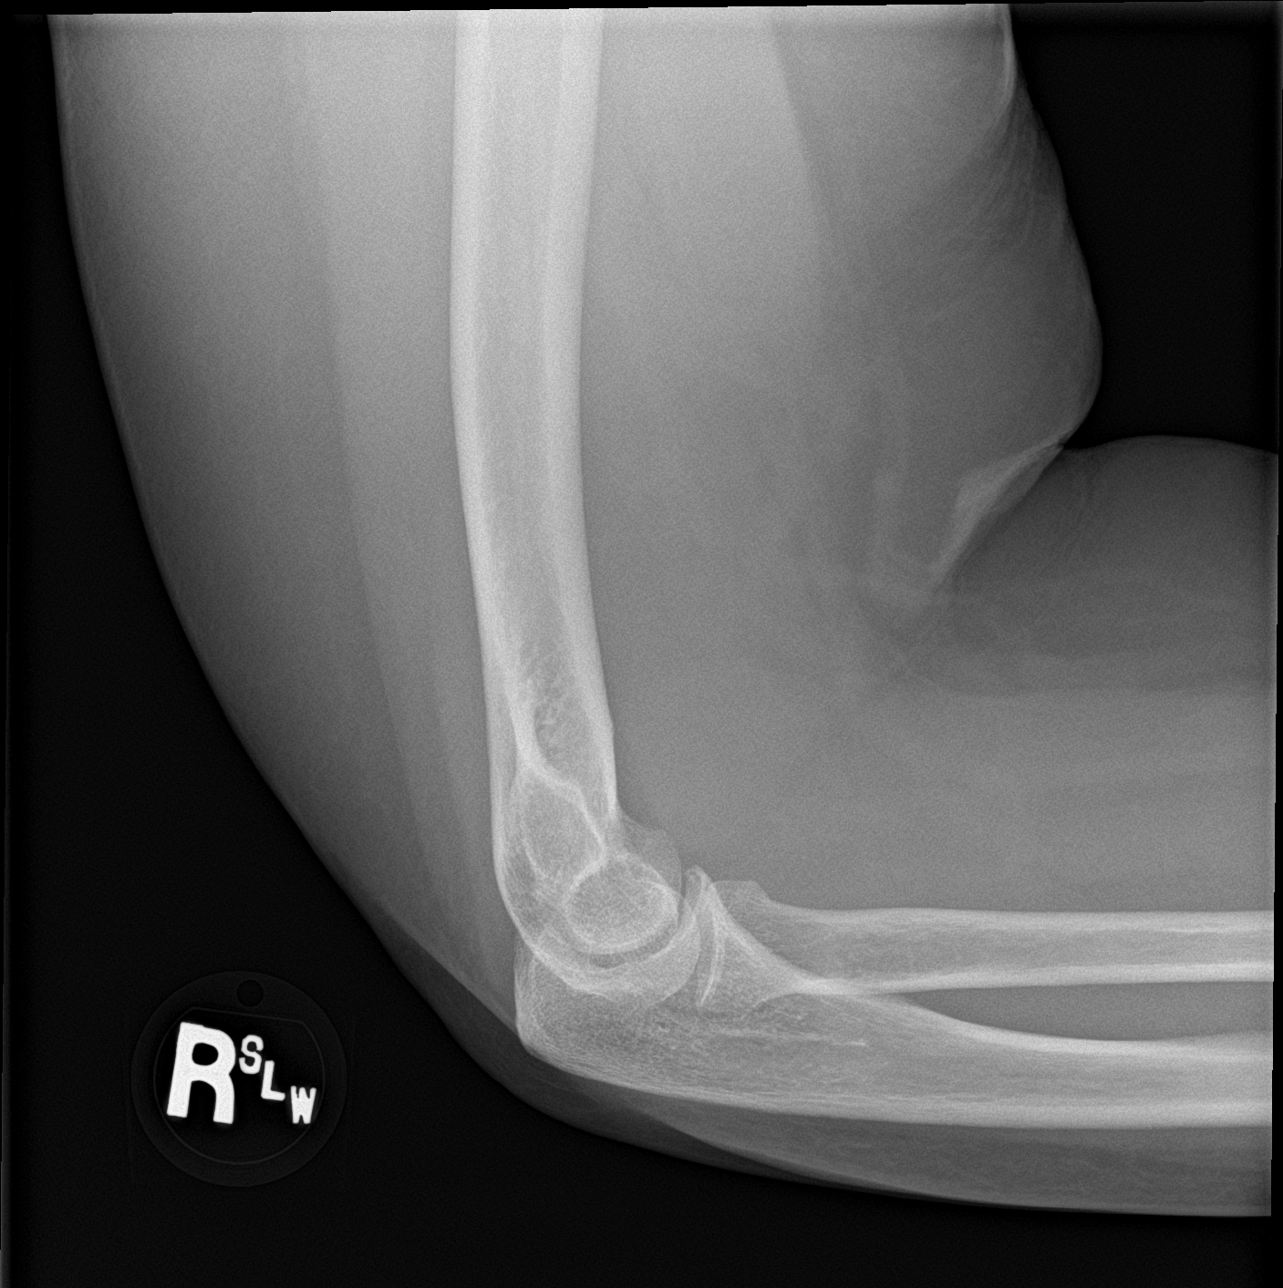

[4 of 4 positions shown; findings below may reference images not displayed]

FINDINGS: There is no evidence of fracture, dislocation, or joint effusion.
There is no evidence of arthropathy or other focal bone abnormality.
Soft tissues are unremarkable.
IMPRESSION: Negative.

## 2022-08-10 ENCOUNTER — Ambulatory Visit (INDEPENDENT_AMBULATORY_CARE_PROVIDER_SITE_OTHER): Payer: BC Managed Care – PPO

## 2022-08-10 ENCOUNTER — Ambulatory Visit: Payer: BC Managed Care – PPO | Admitting: Sports Medicine

## 2022-08-10 DIAGNOSIS — M47812 Spondylosis without myelopathy or radiculopathy, cervical region: Secondary | ICD-10-CM

## 2022-08-10 MED ORDER — MELOXICAM 15 MG PO TABS
ORAL_TABLET | ORAL | 3 refills | Status: AC
Start: 2022-08-10 — End: ?

## 2022-08-10 NOTE — Progress Notes (Signed)
    Procedures performed today:    None.  Independent interpretation of notes and tests performed by another provider:   None.  Brief History, Exam, Impression, and Recommendations:    Cervical spondylosis 62 year old female, we treated for neck pain back in 2018, she is now having recurrence of neck pain left-sided, occasional paresthesias left hand C8 distribution. She talked extensively about her headaches and some chest pain that she had, she will be following up with her PCP for this. I think the neck pain is related to cervical spondylosis, potentially left C8 radiculopathy. Adding x-rays, meloxicam, home physical therapy, return to see me in 6 weeks, MR for interventional planning if not better.  I spent 40 minutes of total time managing this patient today, this includes chart review, face to face, and non-face to face time.  ____________________________________________ Ihor Austin. Benjamin Stain, M.D., ABFM., CAQSM., AME. Primary Care and Sports Medicine [facility]  Adjunct Professor of Family Medicine  [employer]  Restaurant manager, fast food

## 2022-08-10 NOTE — Assessment & Plan Note (Signed)
62 year old female, we treated for neck pain back in 2018, she is now having recurrence of neck pain left-sided, occasional paresthesias left hand C8 distribution. She talked extensively about her headaches and some chest pain that she had, she will be following up with her PCP for this. I think the neck pain is related to cervical spondylosis, potentially left C8 radiculopathy. Adding x-rays, meloxicam, home physical therapy, return to see me in 6 weeks, MR for interventional planning if not better.

## 2022-09-23 ENCOUNTER — Ambulatory Visit: Payer: BC Managed Care – PPO | Admitting: Sports Medicine

## 2023-10-03 ENCOUNTER — Encounter: Payer: Self-pay | Admitting: Sports Medicine
